# Patient Record
Sex: Male | Born: 1960
Health system: Southern US, Community
[De-identification: ages and names within clinical notes are randomized; demographics above are authoritative.]

## PROBLEM LIST (undated history)

## (undated) DIAGNOSIS — I10 Essential (primary) hypertension: Secondary | ICD-10-CM

## (undated) DIAGNOSIS — E785 Hyperlipidemia, unspecified: Secondary | ICD-10-CM

## (undated) DIAGNOSIS — E119 Type 2 diabetes mellitus without complications: Secondary | ICD-10-CM

## (undated) HISTORY — DX: Hyperlipidemia, unspecified: E78.5

## (undated) HISTORY — DX: Essential (primary) hypertension: I10

## (undated) HISTORY — DX: Type 2 diabetes mellitus without complications: E11.9

---

## 2004-09-28 ENCOUNTER — Emergency Department: Payer: Self-pay | Admitting: Emergency Medicine

## 2008-02-02 DIAGNOSIS — I10 Essential (primary) hypertension: Secondary | ICD-10-CM | POA: Insufficient documentation

## 2014-10-18 LAB — LIPID PANEL
Cholesterol: 237 mg/dL — AB (ref 0–200)
HDL: 62 mg/dL (ref 35–70)
LDL Cholesterol: 151 mg/dL
Triglycerides: 122 mg/dL (ref 40–160)

## 2014-10-18 LAB — PSA: PSA: NORMAL

## 2014-12-03 ENCOUNTER — Ambulatory Visit: Payer: Self-pay | Admitting: Gastroenterology

## 2014-12-03 LAB — HM COLONOSCOPY: HM Colonoscopy: NORMAL

## 2015-01-22 LAB — HEMOGLOBIN A1C: Hgb A1c MFr Bld: 8.5 % — AB (ref 4.0–6.0)

## 2015-06-06 ENCOUNTER — Other Ambulatory Visit: Payer: Self-pay | Admitting: Family Medicine

## 2015-06-06 MED ORDER — INSULIN DEGLUDEC 200 UNIT/ML ~~LOC~~ SOPN: 50.0000 [IU] | PEN_INJECTOR | Freq: Every day | SUBCUTANEOUS | Status: DC

## 2015-06-06 MED ORDER — INSULIN ASPART 100 UNIT/ML ~~LOC~~ SOLN: 100.0000 [IU] | Freq: Three times a day (TID) | SUBCUTANEOUS | Status: DC

## 2015-06-06 NOTE — Telephone Encounter (Signed)
Patient has an appt for 08/01/15 and is in need of a refill on Tresiba and [facility] [address]

## 2015-06-12 ENCOUNTER — Other Ambulatory Visit: Payer: Self-pay | Admitting: Family Medicine

## 2015-06-12 ENCOUNTER — Other Ambulatory Visit: Payer: Self-pay

## 2015-06-12 MED ORDER — INSULIN ASPART 100 UNIT/ML ~~LOC~~ SOLN: 20.0000 [IU] | Freq: Three times a day (TID) | SUBCUTANEOUS | Status: DC

## 2015-06-12 MED ORDER — INSULIN DEGLUDEC 200 UNIT/ML ~~LOC~~ SOPN: 50.0000 [IU] | PEN_INJECTOR | Freq: Every day | SUBCUTANEOUS | Status: DC

## 2015-06-12 NOTE — Telephone Encounter (Signed)
Patient requesting refill of 90 day supply of insulin

## 2015-06-20 ENCOUNTER — Encounter: Payer: Self-pay | Admitting: Family Medicine

## 2015-06-20 DIAGNOSIS — N521 Erectile dysfunction due to diseases classified elsewhere: Secondary | ICD-10-CM | POA: Insufficient documentation

## 2015-06-20 DIAGNOSIS — E663 Overweight: Secondary | ICD-10-CM | POA: Insufficient documentation

## 2015-06-20 DIAGNOSIS — E7800 Pure hypercholesterolemia, unspecified: Secondary | ICD-10-CM | POA: Insufficient documentation

## 2015-06-20 DIAGNOSIS — E1140 Type 2 diabetes mellitus with diabetic neuropathy, unspecified: Secondary | ICD-10-CM | POA: Insufficient documentation

## 2015-06-21 ENCOUNTER — Ambulatory Visit (INDEPENDENT_AMBULATORY_CARE_PROVIDER_SITE_OTHER): Payer: 59 | Admitting: Family Medicine

## 2015-06-21 ENCOUNTER — Encounter: Payer: Self-pay | Admitting: Family Medicine

## 2015-06-21 VITALS — BP 124/82 | HR 95 | Temp 98.7°F | Resp 16 | Wt 204.2 lb

## 2015-06-21 DIAGNOSIS — N528 Other male erectile dysfunction: Secondary | ICD-10-CM

## 2015-06-21 DIAGNOSIS — E7800 Pure hypercholesterolemia, unspecified: Secondary | ICD-10-CM

## 2015-06-21 DIAGNOSIS — B3742 Candidal balanitis: Secondary | ICD-10-CM | POA: Diagnosis not present

## 2015-06-21 DIAGNOSIS — N521 Erectile dysfunction due to diseases classified elsewhere: Secondary | ICD-10-CM

## 2015-06-21 DIAGNOSIS — E1140 Type 2 diabetes mellitus with diabetic neuropathy, unspecified: Secondary | ICD-10-CM

## 2015-06-21 DIAGNOSIS — Z79899 Other long term (current) drug therapy: Secondary | ICD-10-CM | POA: Diagnosis not present

## 2015-06-21 DIAGNOSIS — E1149 Type 2 diabetes mellitus with other diabetic neurological complication: Secondary | ICD-10-CM

## 2015-06-21 DIAGNOSIS — N529 Male erectile dysfunction, unspecified: Secondary | ICD-10-CM | POA: Insufficient documentation

## 2015-06-21 MED ORDER — FLUCONAZOLE 150 MG PO TABS: 150.0000 mg | ORAL_TABLET | ORAL | Status: DC

## 2015-06-21 MED ORDER — SILDENAFIL CITRATE 20 MG PO TABS: 20.0000 mg | ORAL_TABLET | Freq: Every day | ORAL | Status: DC | PRN

## 2015-06-21 NOTE — Patient Instructions (Signed)
You can get some Clotrimazole otc ( Monistat ) but do not start today because it may cause increase in irritation  Balanitis Balanitis is inflammation of the head of the penis (glans).  CAUSES  Balanitis has multiple causes, both infectious and noninfectious. Often balanitis is the result of poor personal hygiene, especially in uncircumcised males. Without adequate washing, viruses, bacteria, and yeast collect between the foreskin and the glans. This can cause an infection. Lack of air and irritation from a normal secretion called smegma contribute to the cause in uncircumcised males. Other causes include:  Chemical irritation from the use of certain soaps and shower gels (especially soaps with perfumes), condoms, personal lubricants, petroleum jelly, spermicides, and fabric conditioners.  Skin conditions, such as eczema, dermatitis, and psoriasis.  Allergies to drugs, such as tetracycline and sulfa.  Certain medical conditions, including liver cirrhosis, congestive heart failure, and kidney disease.  Morbid obesity. RISK FACTORS  Diabetes mellitus.  A tight foreskin that is difficult to pull back past the glans (phimosis).  Sex without the use of a condom. SIGNS AND SYMPTOMS  Symptoms may include:  Discharge coming from under the foreskin.  Tenderness.  Itching and inability to get an erection (because of the pain).  Redness and a rash.  Sores on the glans and on the foreskin. DIAGNOSIS Diagnosis of balanitis is confirmed through a physical exam. TREATMENT The treatment is based on the cause of the balanitis. Treatment may include:  Frequent cleansing.  Keeping the glans and foreskin dry.  Use of medicines such as creams, pain medicines, antibiotics, or medicines to treat fungal infections.  Sitz baths. If the irritation has caused a scar on the foreskin that prevents easy retraction, a circumcision may be recommended.  HOME CARE INSTRUCTIONS  Sex should be avoided  until the condition has cleared. MAKE SURE YOU:  Understand these instructions.  Will watch your condition.  Will get help right away if you are not doing well or get worse. Document Released: 04/11/2009 Document Revised: 11/28/2013 Document Reviewed: 05/15/2013 [facility] Patient Information 2015 [place], [place]. This information is not intended to replace advice given to you by your health care provider. Make sure you discuss any questions you have with your health care provider.

## 2015-06-21 NOTE — Progress Notes (Signed)
Name: Joseph Johnson   MRN: [id number]    DOB: 09-02-1961   Date:06/21/2015       Progress Note  Subjective  Chief Complaint  Chief Complaint  Patient presents with  . Medication Refill    HPI  Balanitis: he had one previous episode about two years ago. Symptoms started with itching and some white patches on gland of penis.  Going on for the past four days, the symptoms are getting progressively worse.  No new sexual partners, states that glucose at home has been under control around 120's fasting. He takes [place] that caused glucosuria.   DM with ED: he has a follow up in a few weeks, states glucose is at goal, he has noticed problems with erections for the past year.  He notices problems starting an erection, erection is not as full as it used to be. He is still able to reach an orgasms.   Patient Active Problem List   Diagnosis Date Noted  . ED (erectile dysfunction) 06/21/2015  . 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 Hypercholesteremia 06/20/2015  . Excess weight 06/20/2015  . Benign essential HTN 02/02/2008    No past surgical history on file.  No family history on file.  History   Social History  . Marital Status: Married    Spouse Name: N/A  . Number of Children: N/A  . Years of Education: N/A   Occupational History  . Not on file.   Social History Main Topics  . Smoking status: Former Games developermoker  . Smokeless tobacco: Not on file  . Alcohol Use: No  . Drug Use: No  . Sexual Activity: Not on file   Other Topics Concern  . Not on file   Social History Narrative  . No narrative on file     Current outpatient prescriptions:  .  amLODipine-benazepril (LOTREL) 5-40 MG per capsule, Take by mouth., Disp: , Rfl:  .  aspirin 81 MG tablet, , Disp: , Rfl:  .  atorvastatin (LIPITOR) 40 MG tablet, Take by mouth., Disp: , Rfl:  .  chlorpheniramine (CHLOR-TRIMETON) 4 MG tablet, Take by mouth., Disp: , Rfl:  .   dapagliflozin propanediol (FARXIGA) 10 MG TABS tablet, Take by mouth., Disp: , Rfl:  .  B-D INS SYRINGE 0.5CC/31GX5/16 31G X 5/16" 0.5 ML MISC, 4 (four) times daily., Disp: , Rfl: 5 .  fluconazole (DIFLUCAN) 150 MG tablet, Take 1 tablet (150 mg total) by mouth every other day., Disp: 3 tablet, Rfl: 1 .  insulin aspart (NOVOLOG) 100 UNIT/ML injection, Inject 20 Units into the skin 3 (three) times daily with meals., Disp: 6 vial, Rfl: 0 .  Insulin Degludec (TRESIBA FLEXTOUCH) 200 UNIT/ML SOPN, Inject 50 Units into the skin daily., Disp: 5 pen, Rfl: 0 .  sildenafil (REVATIO) 20 MG tablet, Take 1 tablet (20 mg total) by mouth daily as needed., Disp: 10 tablet, Rfl: 2  No Known Allergies   ROS  Constitutional: Negative for fever or weight change.  Respiratory: Negative for cough and shortness of breath.   Cardiovascular: Negative for chest pain or palpitations.  Gastrointestinal: Negative for abdominal pain, no bowel changes.  Musculoskeletal: Negative for gait problem or joint swelling.  Skin: penile for rash.  Neurological: Negative for dizziness or headache.  No other specific complaints in a complete review of systems (except as listed in HPI above).  Objective  Filed Vitals:   06/21/15 0817  BP: 124/82  Pulse: 95  Temp:  98.7 F (37.1 C)  Resp: 16  Weight: 204 lb 3 oz (92.619 kg)  SpO2: 97%    Body mass index is 29.3 kg/(m^2).  Physical Exam  Constitutional: Patient appears well-developed and well-nourished. No distress.  Eyes:  No scleral icterus.  Neck: Normal range of motion. Neck supple. Cardiovascular: Normal rate, regular rhythm and normal heart sounds.  No murmur heard. No BLE edema. Pulmonary/Chest: Effort normal and breath sounds normal. No respiratory distress. Abdominal: Soft.  There is no tenderness. Genitalia: uncircumcised penis, some swelling of the foreskin, gland of penis is erythematous with white plaques tric: Patient has a normal mood and affect.  behavior is normal. Judgment and thought content normal.   PHQ2/9: Depression screen PHQ 2/9 06/21/2015  Decreased Interest 0  Down, Depressed, Hopeless 0  PHQ - 2 Score 0    Fall Risk: Fall Risk  06/21/2015  Falls in the past year? No    Assessment & Plan  1. 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 Other male erectile dysfunction Discussed risk and benefits, explained medication is used for PAD, and is off label use for ED, advised to read side effects of medication, including visual disturbances - sildenafil (REVATIO) 20 MG tablet; Take 1 tablet (20 mg total) by mouth daily as needed.  Dispense: 10 tablet; Refill: 2  3. Candidal balanitis Start  Medication, drink more water - fluconazole (DIFLUCAN) 150 MG tablet; Take 1 tablet (150 mg total) by mouth every other day.  Dispense: 3 tablet; Refill: 1  4. Hypercholesterolemia  - Lipid Profile Before next visit  5. Long-term use of high-risk medication  - Comprehensive Metabolic Panel (CMET)

## 2015-07-04 ENCOUNTER — Other Ambulatory Visit: Payer: Self-pay

## 2015-07-05 ENCOUNTER — Other Ambulatory Visit: Payer: Self-pay

## 2015-07-05 MED ORDER — INSULIN DEGLUDEC 200 UNIT/ML ~~LOC~~ SOPN: 50.0000 [IU] | PEN_INJECTOR | Freq: Every day | SUBCUTANEOUS | Status: DC

## 2015-07-05 NOTE — Telephone Encounter (Signed)
Patient requesting refill. 

## 2015-07-08 ENCOUNTER — Other Ambulatory Visit: Payer: Self-pay

## 2015-07-08 MED ORDER — INSULIN DEGLUDEC 200 UNIT/ML ~~LOC~~ SOPN: 50.0000 [IU] | PEN_INJECTOR | Freq: Every day | SUBCUTANEOUS | Status: DC

## 2015-07-08 NOTE — Telephone Encounter (Signed)
[facility] [place] [facility] that patient medication for Joseph Johnson must be a 90 day supply. Can you please switch and sent refill request back in. Thanks

## 2015-07-09 MED ORDER — INSULIN DEGLUDEC 200 UNIT/ML ~~LOC~~ SOPN: 50.0000 [IU] | PEN_INJECTOR | Freq: Every day | SUBCUTANEOUS | Status: DC

## 2015-08-01 ENCOUNTER — Ambulatory Visit: Payer: Self-pay | Admitting: Family Medicine

## 2015-08-01 LAB — HEMOGLOBIN A1C
Est. average glucose Bld gHb Est-mCnc: 232 mg/dL
Hgb A1c MFr Bld: 9.7 % — ABNORMAL HIGH (ref 4.8–5.6)

## 2015-08-02 LAB — LIPID PANEL
Chol/HDL Ratio: 4.8 ratio units (ref 0.0–5.0)
Cholesterol, Total: 249 mg/dL — ABNORMAL HIGH (ref 100–199)
HDL: 52 mg/dL (ref >39)
LDL Calculated: 144 mg/dL — ABNORMAL HIGH (ref 0–99)
Triglycerides: 265 mg/dL — ABNORMAL HIGH (ref 0–149)
VLDL Cholesterol Cal: 53 mg/dL — ABNORMAL HIGH (ref 5–40)

## 2015-08-02 LAB — COMPREHENSIVE METABOLIC PANEL
ALT: 16 IU/L (ref 0–44)
AST: 17 IU/L (ref 0–40)
Albumin/Globulin Ratio: 2.1 (ref 1.1–2.5)
Albumin: 4.2 g/dL (ref 3.5–5.5)
Alkaline Phosphatase: 71 IU/L (ref 39–117)
BUN/Creatinine Ratio: 14 (ref 9–20)
BUN: 15 mg/dL (ref 6–24)
Bilirubin Total: 1.4 mg/dL — ABNORMAL HIGH (ref 0.0–1.2)
CO2: 26 mmol/L (ref 18–29)
Calcium: 9.5 mg/dL (ref 8.7–10.2)
Chloride: 98 mmol/L (ref 97–108)
Creatinine, Ser: 1.06 mg/dL (ref 0.76–1.27)
GFR calc Af Amer: 93 mL/min/{1.73_m2} (ref >59)
GFR calc non Af Amer: 81 mL/min/{1.73_m2} (ref >59)
Globulin, Total: 2 g/dL (ref 1.5–4.5)
Glucose: 289 mg/dL — ABNORMAL HIGH (ref 65–99)
Potassium: 4.7 mmol/L (ref 3.5–5.2)
Sodium: 140 mmol/L (ref 134–144)
Total Protein: 6.2 g/dL (ref 6.0–8.5)

## 2015-08-02 NOTE — Progress Notes (Signed)
Pt notified and will schedule another appointment

## 2015-08-06 NOTE — Progress Notes (Signed)
Pt notified needed to make appointment

## 2015-08-08 ENCOUNTER — Ambulatory Visit: Payer: Self-pay | Admitting: Family Medicine

## 2015-09-20 ENCOUNTER — Encounter: Payer: Self-pay | Admitting: Family Medicine

## 2015-09-20 ENCOUNTER — Ambulatory Visit (INDEPENDENT_AMBULATORY_CARE_PROVIDER_SITE_OTHER): Payer: 59 | Admitting: Family Medicine

## 2015-09-20 VITALS — BP 138/86 | HR 100 | Temp 98.2°F | Resp 18 | Ht 70.0 in | Wt 196.7 lb

## 2015-09-20 DIAGNOSIS — I10 Essential (primary) hypertension: Secondary | ICD-10-CM | POA: Diagnosis not present

## 2015-09-20 DIAGNOSIS — Z23 Encounter for immunization: Secondary | ICD-10-CM

## 2015-09-20 DIAGNOSIS — E7800 Pure hypercholesterolemia, unspecified: Secondary | ICD-10-CM

## 2015-09-20 DIAGNOSIS — E1140 Type 2 diabetes mellitus with diabetic neuropathy, unspecified: Secondary | ICD-10-CM

## 2015-09-20 DIAGNOSIS — N521 Erectile dysfunction due to diseases classified elsewhere: Secondary | ICD-10-CM | POA: Diagnosis not present

## 2015-09-20 DIAGNOSIS — E1149 Type 2 diabetes mellitus with other diabetic neurological complication: Secondary | ICD-10-CM

## 2015-09-20 DIAGNOSIS — Z9114 Patient's other noncompliance with medication regimen: Secondary | ICD-10-CM

## 2015-09-20 DIAGNOSIS — M25512 Pain in left shoulder: Secondary | ICD-10-CM

## 2015-09-20 DIAGNOSIS — Z91148 Patient's other noncompliance with medication regimen for other reason: Secondary | ICD-10-CM

## 2015-09-20 LAB — POCT UA - MICROALBUMIN: Microalbumin Ur, POC: 20 mg/L

## 2015-09-20 MED ORDER — AMLODIPINE BESY-BENAZEPRIL HCL 5-40 MG PO CAPS: 1.0000 | ORAL_CAPSULE | Freq: Every day | ORAL | Status: DC

## 2015-09-20 MED ORDER — INSULIN ASPART 100 UNIT/ML ~~LOC~~ SOLN: 20.0000 [IU] | Freq: Three times a day (TID) | SUBCUTANEOUS | Status: DC

## 2015-09-20 MED ORDER — INSULIN DEGLUDEC 200 UNIT/ML ~~LOC~~ SOPN: 50.0000 [IU] | PEN_INJECTOR | Freq: Every day | SUBCUTANEOUS | Status: DC

## 2015-09-20 MED ORDER — ATORVASTATIN CALCIUM 40 MG PO TABS: 40.0000 mg | ORAL_TABLET | Freq: Every day | ORAL | Status: DC

## 2015-09-20 NOTE — Progress Notes (Signed)
Name: Joseph Johnson   MRN: [id number]    DOB: 11-12-1961   Date:09/20/2015       Progress Note  Subjective  Chief Complaint  Chief Complaint  Patient presents with  . Medication Refill  . Diabetes    Checks BS once mid-day, Low-62 High-267  . Hypertension    Patient has been walking 2 miles daily and has losted 8 pounds since last visit and cut back on medication to once daily.  . Hyperlipidemia    HPI   DM with ED: he has a follow up in a few weeks, states glucose is at goal, he has noticed problems with erections for the past year. He notices problems starting an erection, erection is not as full as it used to be. He is still able to reach an orgasms. He tried to get prescription at pharmacy but it was too expensive.  He is only on [place] and Novolog at this time. Did not like side effects of Farxiga - had balanitis and urinary frequency.  He has lost weight since last visit , walking 2 miles per day, he has been compliant with insulin for the past month.  He denies polyphagia, polyuria or polydipsia  HTN: he has been skipping medication of Lotrel. Taking it only once daily, because his bp has been under control since he started to walk daily , denies chest pain or palpitatoin  Hyperlipidemia: not taking Atorvastatin at this time, reviewed labs with him, LDL was 141, and high triglycerides, discussed risk of heart attacks and strokes with his today  Shoulder pain left: symptoms started 2 years ago, gradually increased but is gradually improving with physical activity over the past month. Pain is described as sharp sensation that is triggered by certain movements - such as twisting left arm down and internal rotation    Patient Active Problem List   Diagnosis Date Noted  . ED (erectile dysfunction) 06/21/2015  . 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 Hypercholesteremia 06/20/2015  . Excess weight 06/20/2015  . Benign  essential HTN 02/02/2008    History reviewed. No pertinent past surgical history.  Family History  Problem Relation Age of Onset  . Hypertension Mother   . Diabetes Father   . Diabetes Brother     Social History   Social History  . Marital Status: Married    Spouse Name: N/A  . Number of Children: N/A  . Years of Education: N/A   Occupational History  . Not on file.   Social History Main Topics  . Smoking status: Former Smoker    Types: Cigarettes    Quit date: 09/19/1990  . Smokeless tobacco: Never Used  . Alcohol Use: No  . Drug Use: No  . Sexual Activity:    Partners: Female   Other Topics Concern  . Not on file   Social History Narrative     Current outpatient prescriptions:  .  amLODipine-benazepril (LOTREL) 5-40 MG per capsule, Take by mouth., Disp: , Rfl:  .  aspirin 81 MG tablet, , Disp: , Rfl:  .  atorvastatin (LIPITOR) 40 MG tablet, Take by mouth., Disp: , Rfl:  .  B-D INS SYRINGE 0.5CC/31GX5/16 31G X 5/16" 0.5 ML MISC, 4 (four) times daily., Disp: , Rfl: 5 .  insulin aspart (NOVOLOG) 100 UNIT/ML injection, Inject 20 Units into the skin 3 (three) times daily with meals., Disp: 6 vial, Rfl: 0 .  Insulin Degludec (TRESIBA FLEXTOUCH) 200 UNIT/ML SOPN,  Inject 50 Units into the skin daily., Disp: 5 pen, Rfl: 0  No Known Allergies   ROS  Constitutional: Negative for fever , positive for weight change.  Respiratory: Positive for mild  cough no  shortness of breath.  He has a mild cold, taking otc medication and is helping Cardiovascular: Negative for chest pain or palpitations.  Gastrointestinal: Negative for abdominal pain, no bowel changes.  Musculoskeletal: Negative for gait problem or joint swelling.  Skin: Negative for rash.  Neurological: Negative for dizziness or headache.  No other specific complaints in a complete review of systems (except as listed in HPI above).  Objective  Filed Vitals:   09/20/15 1020  BP: 138/86  Pulse: 100  Temp:  98.2 F (36.8 C)  TempSrc: Oral  Resp: 18  Height:  (1.778 m)  Weight: 196 lb 11.2 oz (89.223 kg)  SpO2: 98%    Body mass index is 28.22 kg/(m^2).  Physical Exam  Constitutional: Patient appears well-developed and well-nourished.  No distress.  HEENT: head atraumatic, normocephalic, pupils equal and reactive to light, , neck supple, throat within normal limits Cardiovascular: Normal rate, regular rhythm and normal heart sounds.  No murmur heard. No BLE edema. Pulmonary/Chest: Effort normal and breath sounds normal. No respiratory distress. Abdominal: Soft.  There is no tenderness. Psychiatric: Patient has a normal mood and affect. behavior is normal. Judgment and thought content normal. Muscular Skeletal: pain on top of left shoulder during internal rotation, no rashes, negative empty can, normal abduction   Recent Results (from the past 2160 hour(s))  Lipid Profile     Status: Abnormal   Collection Time: 08/01/15  9:19 AM  Result Value Ref Range   Cholesterol, Total 249 (H) 100 - 199 mg/dL   Triglycerides 161 (H) 0 - 149 mg/dL   HDL 52 >09 mg/dL    Comment: According to ATP-III Guidelines, HDL-C >59 mg/dL is considered a negative risk factor for CHD.    VLDL Cholesterol Cal 53 (H) 5 - 40 mg/dL   LDL Calculated 604 (H) 0 - 99 mg/dL   Chol/HDL Ratio 4.8 0.0 - 5.0 ratio units    Comment:                                   T. Chol/HDL Ratio                                             Men  Women                               1/2 Avg.Risk  3.4    3.3                                   Avg.Risk  5.0    4.4                                2X Avg.Risk  9.6    7.1  3X Avg.Risk 23.4   11.0   Comprehensive Metabolic Panel (CMET)     Status: Abnormal   Collection Time: 08/01/15  9:19 AM  Result Value Ref Range   Glucose 289 (H) 65 - 99 mg/dL   BUN 15 6 - 24 mg/dL   Creatinine, Ser 7.821.06 0.76 - 1.27 mg/dL   GFR calc non Af Amer 81 >59 mL/min/1.73    GFR calc Af Amer 93 >59 mL/min/1.73   BUN/Creatinine Ratio 14 9 - 20   Sodium 140 134 - 144 mmol/L   Potassium 4.7 3.5 - 5.2 mmol/L   Chloride 98 97 - 108 mmol/L   CO2 26 18 - 29 mmol/L   Calcium 9.5 8.7 - 10.2 mg/dL   Total Protein 6.2 6.0 - 8.5 g/dL   Albumin 4.2 3.5 - 5.5 g/dL   Globulin, Total 2.0 1.5 - 4.5 g/dL   Albumin/Globulin Ratio 2.1 1.1 - 2.5   Bilirubin Total 1.4 (H) 0.0 - 1.2 mg/dL   Alkaline Phosphatase 71 39 - 117 IU/L   AST 17 0 - 40 IU/L   ALT 16 0 - 44 IU/L  HgB A1c     Status: Abnormal   Collection Time: 08/01/15  9:19 AM  Result Value Ref Range   Hgb A1c MFr Bld 9.7 (H) 4.8 - 5.6 %    Comment:          Pre-diabetes: 5.7 - 6.4          Diabetes: >6.4          Glycemic control for adults with diabetes: <7.0    Est. average glucose Bld gHb Est-mCnc 232 mg/dL  POCT UA - Microalbumin     Status: None   Collection Time: 09/20/15 10:35 AM  Result Value Ref Range   Microalbumin Ur, POC 20 mg/L   Creatinine, POC  mg/dL   Albumin/Creatinine Ratio, Urine, POC      PHQ2/9: Depression screen [facility] 2/9 09/20/2015 06/21/2015  Decreased Interest 0 0  Down, Depressed, Hopeless 0 0  PHQ - 2 Score 0 0    Fall Risk: Fall Risk  09/20/2015 06/21/2015  Falls in the past year? No No    Functional Status Survey: Is the patient deaf or have difficulty hearing?: No Does the patient have difficulty seeing, even when wearing glasses/contacts?: Yes (glasses) Does the patient have difficulty concentrating, remembering, or making decisions?: No Does the patient have difficulty walking or climbing stairs?: No Does the patient have difficulty dressing or bathing?: No Does the patient have difficulty doing errands alone such as visiting a doctor's office or shopping?: No   Assessment & Plan    1. Type II diabetes mellitus with neuropathy causing erectile dysfunction (HCC)   needs to be compliant with diet and continue physical activity, continue daily insulin medication, he  is unable to tolerate metformin or Farxiga.   - POCT UA - Microalbumin - insulin aspart (NOVOLOG) 100 UNIT/ML injection; Inject 20 Units into the skin 3 (three) times daily with meals.  Dispense: 6 vial; Refill: 0 - Insulin Degludec (TRESIBA FLEXTOUCH) 200 UNIT/ML SOPN; Inject 50 Units into the skin daily.  Dispense: 15 pen; Refill: 1  2. Benign essential HTN  - amLODipine-benazepril (LOTREL) 5-40 MG capsule; Take 1 capsule by mouth daily.  Dispense: 90 capsule; Refill: 1  3. Erectile dysfunction due to diseases classified elsewhere  Can't afford medication   4. Hypercholesteremia  Needs to take medication daily  - atorvastatin (LIPITOR) 40 MG tablet; Take 1  tablet (40 mg total) by mouth daily.  Dispense: 90 tablet; Refill: 1  5. Non compliance w medication regimen  Discussed risk of not taking medication   6. Needs flu shot  - Flu Vaccine QUAD 36+ mos PF IM (Fluarix & Fluzone Quad PF)  7. Left shoulder pain  Rotator cuff symptoms, discussed PT but he wants to hold off , he states it is improving with home exercises.

## 2015-11-11 ENCOUNTER — Ambulatory Visit (INDEPENDENT_AMBULATORY_CARE_PROVIDER_SITE_OTHER): Payer: 59 | Admitting: Family Medicine

## 2015-11-11 ENCOUNTER — Encounter: Payer: Self-pay | Admitting: Family Medicine

## 2015-11-11 VITALS — BP 138/88 | HR 106 | Temp 98.6°F | Resp 18 | Ht 70.0 in | Wt 191.5 lb

## 2015-11-11 DIAGNOSIS — B3742 Candidal balanitis: Secondary | ICD-10-CM | POA: Diagnosis not present

## 2015-11-11 DIAGNOSIS — E7800 Pure hypercholesterolemia, unspecified: Secondary | ICD-10-CM

## 2015-11-11 DIAGNOSIS — N521 Erectile dysfunction due to diseases classified elsewhere: Secondary | ICD-10-CM

## 2015-11-11 DIAGNOSIS — Z23 Encounter for immunization: Secondary | ICD-10-CM

## 2015-11-11 DIAGNOSIS — E1149 Type 2 diabetes mellitus with other diabetic neurological complication: Secondary | ICD-10-CM

## 2015-11-11 DIAGNOSIS — I10 Essential (primary) hypertension: Secondary | ICD-10-CM

## 2015-11-11 DIAGNOSIS — E1140 Type 2 diabetes mellitus with diabetic neuropathy, unspecified: Secondary | ICD-10-CM

## 2015-11-11 LAB — POCT GLYCOSYLATED HEMOGLOBIN (HGB A1C): Hemoglobin A1C: 10.4

## 2015-11-11 MED ORDER — GLUCOSE BLOOD VI STRP: ORAL_STRIP | Status: DC

## 2015-11-11 MED ORDER — FLUCONAZOLE 150 MG PO TABS: 150.0000 mg | ORAL_TABLET | ORAL | Status: DC

## 2015-11-11 NOTE — Progress Notes (Signed)
Name: Joseph Johnson   MRN: [id number]    DOB: [birth year]   Date:11/11/2015       Progress Note  Subjective  Chief Complaint  Chief Complaint  Patient presents with  . Medication Refill    5 week F/U  . Hypertension    Checks at home and get's 130/90 with no side effects   . Diabetes    Checks once daily averages about 120-130 highest has been 230, but has been without strips for the past 2 weeks.  . Hyperlipidemia    HPI  DM with ED: Glucose has improved at home. He has noticed problems with erections for the past year. He notices problems starting an erection, erection is not as full as it used to be. He is still able to reach an orgasms. He tried to get prescription at pharmacy but it was too expensive. He is only on [place] and Novolog at this time, however only using Novolog once or  twice daily . Did not like side effects of Farxiga - had balanitis and urinary frequency, Metformin caused diarrhea He has lost weight since last visit 5.5 lbs , walking 2 miles per day.  He denies polyphagia or polydipsia. However had an episode of urinary frequency a couple of weeks ago. He stopped taking Atorvastatin it was a side effect of medication, but explained that it was likely secondary to glucose not being controlled. He has changed his diet and  has decreased the amount that he eats. Avoiding bread, occasionally has sweet beverages  HTN: he has been compliant with Lotrel lately. Taking it twice daily again. He denies chest pain or palpitatoin  Hyperlipidemia: not taking Atorvastatin at this time, reviewed labs with him, LDL was 141, and high triglycerides, discussed risk of heart attacks and strokes with his today.   Balanitis: he states he has noticed the symptoms recurring two days ago, with itching and redness and would like a refill of Diflucan  Patient Active Problem List   Diagnosis Date Noted  . ED (erectile dysfunction) 06/21/2015  . Type II diabetes mellitus with neuropathy  causing erectile dysfunction (HCC) 06/20/2015  . Hypercholesteremia 06/20/2015  . Excess weight 06/20/2015  . Benign essential HTN 02/02/2008    History reviewed. No pertinent past surgical history.  Family History  Problem Relation Age of Onset  . Hypertension Mother   . Diabetes Father   . Diabetes Brother     Social History   Social History  . Marital Status: Married    Spouse Name: N/A  . Number of Children: N/A  . Years of Education: N/A   Occupational History  . Not on file.   Social History Main Topics  . Smoking status: Former Smoker    Types: Cigarettes    Quit date: 09/19/1990  . Smokeless tobacco: Never Used  . Alcohol Use: No  . Drug Use: No  . Sexual Activity:    Partners: Female   Other Topics Concern  . Not on file   Social History Narrative     Current outpatient prescriptions:  .  amLODipine-benazepril (LOTREL) 5-40 MG capsule, Take 1 capsule by mouth daily., Disp: 90 capsule, Rfl: 1 .  aspirin 81 MG tablet, , Disp: , Rfl:  .  atorvastatin (LIPITOR) 40 MG tablet, Take 1 tablet (40 mg total) by mouth daily., Disp: 90 tablet, Rfl: 1 .  B-D INS SYRINGE 0.5CC/31GX5/16 31G X 5/16" 0.5 ML MISC, 4 (four) times daily., Disp: , Rfl: 5 .  insulin  aspart (NOVOLOG) 100 UNIT/ML injection, Inject 20 Units into the skin 3 (three) times daily with meals., Disp: 6 vial, Rfl: 0 .  Insulin Degludec (TRESIBA FLEXTOUCH) 200 UNIT/ML SOPN, Inject 50 Units into the skin daily., Disp: 15 pen, Rfl: 1 .  fluconazole (DIFLUCAN) 150 MG tablet, Take 1 tablet (150 mg total) by mouth every other day., Disp: 6 tablet, Rfl: 2 .  glucose blood (ONE TOUCH ULTRA TEST) test strip, Use as instructed, Disp: 100 each, Rfl: 12  No Known Allergies   ROS  Constitutional: Negative for fever and positive for weight change.  Respiratory: Negative for cough and shortness of breath.   Cardiovascular: Negative for chest pain or palpitations.  Gastrointestinal: Negative for abdominal pain,  no bowel changes.  Musculoskeletal: Negative for gait problem or joint swelling.  Skin: Positive for rash.  Neurological: Negative for dizziness or headache.  No other specific complaints in a complete review of systems (except as listed in HPI above).  Objective  Filed Vitals:   11/11/15 1032  BP: 138/88  Pulse: 106  Temp: 98.6 F (37 C)  TempSrc: Oral  Resp: 18  Height:  (1.778 m)  Weight: 191 lb 8 oz (86.864 kg)  SpO2: 96%    Body mass index is 27.48 kg/(m^2).  Physical Exam  Constitutional: Patient appears well-developed and well-nourished. No distress.  HEENT: head atraumatic, normocephalic, pupils equal and reactive to lightneck supple, throat within normal limits Cardiovascular: Normal rate, regular rhythm and normal heart sounds.  No murmur heard. No BLE edema. Pulmonary/Chest: Effort normal and breath sounds normal. No respiratory distress. Abdominal: Soft.  There is no tenderness. Psychiatric: Patient has a normal mood and affect. behavior is normal. Judgment and thought content normal.  Recent Results (from the past 2160 hour(s))  POCT UA - Microalbumin     Status: None   Collection Time: 09/20/15 10:35 AM  Result Value Ref Range   Microalbumin Ur, POC 20 mg/L   Creatinine, POC  mg/dL   Albumin/Creatinine Ratio, Urine, POC    POCT HgB A1C     Status: Abnormal   Collection Time: 11/11/15 10:51 AM  Result Value Ref Range   Hemoglobin A1C 10.4      PHQ2/9: Depression screen [facility] 2/9 09/20/2015 06/21/2015  Decreased Interest 0 0  Down, Depressed, Hopeless 0 0  PHQ - 2 Score 0 0    Fall Risk: Fall Risk  09/20/2015 06/21/2015  Falls in the past year? No No     Assessment & Plan  1. Type II diabetes mellitus with neuropathy causing erectile dysfunction (HCC)  - POCT HgB A1C 10.4. Explained to him that the goal is to get below 7. Advised referral to Endo or dietician, but he refuses both. He states he will start to do what he is supposed to do.  Like using insulin pre meals. Also following his diet, continue physical activity - glucose blood (ONE TOUCH ULTRA TEST) test strip; Use as instructed  Dispense: 100 each; Refill: 12  2. Candidal balanitis  - fluconazole (DIFLUCAN) 150 MG tablet; Take 1 tablet (150 mg total) by mouth every other day.  Dispense: 6 tablet; Refill: 2  3. Hypercholesteremia  Needs to resume statin therapy   4. Benign essential HTN  Doing well on medication   5. Need for Streptococcus pneumoniae vaccination  - Pneumococcal conjugate vaccine 13-valent IM

## 2015-12-27 ENCOUNTER — Ambulatory Visit: Payer: 59 | Admitting: Family Medicine

## 2016-01-03 ENCOUNTER — Encounter: Payer: Self-pay | Admitting: Family Medicine

## 2016-01-03 ENCOUNTER — Ambulatory Visit (INDEPENDENT_AMBULATORY_CARE_PROVIDER_SITE_OTHER): Payer: 59 | Admitting: Family Medicine

## 2016-01-03 VITALS — BP 132/76 | HR 92 | Temp 98.0°F | Resp 16 | Ht 70.0 in | Wt 199.1 lb

## 2016-01-03 DIAGNOSIS — E1149 Type 2 diabetes mellitus with other diabetic neurological complication: Secondary | ICD-10-CM | POA: Diagnosis not present

## 2016-01-03 DIAGNOSIS — J40 Bronchitis, not specified as acute or chronic: Secondary | ICD-10-CM | POA: Diagnosis not present

## 2016-01-03 DIAGNOSIS — N521 Erectile dysfunction due to diseases classified elsewhere: Secondary | ICD-10-CM | POA: Diagnosis not present

## 2016-01-03 DIAGNOSIS — E1140 Type 2 diabetes mellitus with diabetic neuropathy, unspecified: Secondary | ICD-10-CM

## 2016-01-03 DIAGNOSIS — Z23 Encounter for immunization: Secondary | ICD-10-CM | POA: Diagnosis not present

## 2016-01-03 MED ORDER — FLUTICASONE FUROATE-VILANTEROL 100-25 MCG/INH IN AEPB: 1.0000 | INHALATION_SPRAY | Freq: Every day | RESPIRATORY_TRACT | Status: DC

## 2016-01-03 MED ORDER — HYDROCOD POLST-CPM POLST ER 10-8 MG/5ML PO SUER: 5.0000 mL | Freq: Two times a day (BID) | ORAL | Status: DC | PRN

## 2016-01-03 MED ORDER — GLUCOSE BLOOD VI STRP: ORAL_STRIP | Status: DC

## 2016-01-03 NOTE — Progress Notes (Signed)
Name: Joseph Johnson   MRN: [id number]    DOB: 1960/12/24   Date:01/03/2016       Progress Note  Subjective  Chief Complaint  Chief Complaint  Patient presents with  . Follow-up    6 week F/U  . Diabetes    Checking sugar once or twice daily, pre-prandial sugar highest-243 lowest-48 on 12/31/15 Average-80-130. Drank orange juice when patient was having hypogylcemia episode.   . Bronchitis    Patient went to [place] on Sunday morning and was given antibiotics, inhaler, and cough medication. But is still wheezing and feels like he needs something a little stronger for his cough.     HPI  DMII: he has been checking glucose at home and fasting has been around 140. He had three episodes of hypoglycemia. 48 at 3 am, and 52 before lunch once and 66 before breakfast.No postprandial readings, highest level was this week was 243, he states he had a birthday cake that day. He knows when it is low, shaking, sweaty. He always has orange juice by his bed, also advised to have a peppermint with him at all times. I also discussed glucagon emergency kit and an emergency bracelet. He states he does not think his wife would be able to give him an injection and would like to hold off on Glucagon. He has been compliant with his medication, and states that his balanitis cleared up.   Bronchitis: he went to Urgent care for cough and wheezing over the weekend. He was diagnosed with Bronchitis and given a Z-pack, Ventolin and tessalon perles. He states the cough is worse at night and would like to try something else. The wheezing is slightly better, but he would like to feel better quicker. No fever, no chills, normal appetite.   Patient Active Problem List   Diagnosis Date Noted  . ED (erectile dysfunction) 06/21/2015  . 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 Hypercholesteremia 06/20/2015  . Excess weight 06/20/2015  . Benign essential HTN 02/02/2008     No past surgical history on file.  Family History  Problem Relation Age of Onset  . Hypertension Mother   . Diabetes Father   . Diabetes Brother     Social History   Social History  . Marital Status: Married    Spouse Name: N/A  . Number of Children: N/A  . Years of Education: N/A   Occupational History  . Not on file.   Social History Main Topics  . Smoking status: Former Smoker    Types: Cigarettes    Quit date: 09/19/1990  . Smokeless tobacco: Never Used  . Alcohol Use: No  . Drug Use: No  . Sexual Activity:    Partners: Female   Other Topics Concern  . Not on file   Social History Narrative     Current outpatient prescriptions:  .  amLODipine-benazepril (LOTREL) 5-40 MG capsule, Take 1 capsule by mouth daily., Disp: 90 capsule, Rfl: 1 .  aspirin 81 MG tablet, , Disp: , Rfl:  .  atorvastatin (LIPITOR) 40 MG tablet, Take 1 tablet (40 mg total) by mouth daily., Disp: 90 tablet, Rfl: 1 .  azithromycin (ZITHROMAX) 250 MG tablet, TAKE 2 TABLETS BY MOUTH TODAY, THEN TAKE 1 TABLET DAILY FOR 4 DAYS, Disp: , Rfl: 0 .  B-D INS SYRINGE 0.5CC/31GX5/16 31G X 5/16" 0.5 ML MISC, 4 (four) times daily., Disp: , Rfl: 5 .  benzonatate (TESSALON) 200 MG capsule, TAKE  ONE CAPSULE BY MOUTH 3 TIMES A DAY AS NEEDED, Disp: , Rfl: 0 .  chlorpheniramine-HYDROcodone (TUSSIONEX PENNKINETIC ER) 10-8 MG/5ML SUER, Take 5 mLs by mouth every 12 (twelve) hours as needed., Disp: 140 mL, Rfl: 0 .  Fluticasone Furoate-Vilanterol (BREO ELLIPTA) 100-25 MCG/INH AEPB, Inhale 1 puff into the lungs daily., Disp: 60 each, Rfl: 0 .  glucose blood (ONE TOUCH ULTRA TEST) test strip, Use as instructed, Disp: 100 each, Rfl: 2 .  insulin aspart (NOVOLOG) 100 UNIT/ML injection, Inject 20 Units into the skin 3 (three) times daily with meals., Disp: 6 vial, Rfl: 0 .  Insulin Degludec (TRESIBA FLEXTOUCH) 200 UNIT/ML SOPN, Inject 50 Units into the skin daily., Disp: 15 pen, Rfl: 1 .  VENTOLIN HFA 108 (90 Base)  MCG/ACT inhaler, INHALE 2 PUFFS EVERY 4-6 HOURS AS NEEDED, Disp: , Rfl: 0  No Known Allergies   ROS  Constitutional: Negative for fever or weight change.  Respiratory: Positive for cough no  shortness of breath.   Cardiovascular: Negative for chest pain or palpitations.  Gastrointestinal: Negative for abdominal pain, no bowel changes.  Musculoskeletal: Negative for gait problem or joint swelling.  Skin: Negative for rash.  Neurological: Negative for dizziness or headache.  No other specific complaints in a complete review of systems (except as listed in HPI above).  Objective  Filed Vitals:   01/03/16 1137  BP: 132/76  Pulse: 92  Temp: 98 F (36.7 C)  TempSrc: Oral  Resp: 16  Height: 5' 10"  (1.778 m)  Weight: 199 lb 1.6 oz (90.311 kg)  SpO2: 98%    Body mass index is 28.57 kg/(m^2).  Physical Exam  Constitutional: Patient appears well-developed and well-nourished.No distress.  HEENT: head atraumatic, normocephalic, pupils equal and reactive to light, ears normal TM, neck supple, throat within normal limits Cardiovascular: Normal rate, regular rhythm and normal heart sounds.  No murmur heard. No BLE edema. Pulmonary/Chest: Effort normal diffuse rhonchi and expiratory wheezing Abdominal: Soft.  There is no tenderness. Psychiatric: Patient has a normal mood and affect. behavior is normal. Judgment and thought content normal.  Recent Results (from the past 2160 hour(s))  POCT HgB A1C     Status: Abnormal   Collection Time: 11/11/15 10:51 AM  Result Value Ref Range   Hemoglobin A1C 10.4      PHQ2/9: Depression screen [facility] 2/9 01/03/2016 09/20/2015 06/21/2015  Decreased Interest 0 0 0  Down, Depressed, Hopeless 0 0 0  PHQ - 2 Score 0 0 0     Fall Risk: Fall Risk  01/03/2016 09/20/2015 06/21/2015  Falls in the past year? No No No     Functional Status Survey: Is the patient deaf or have difficulty hearing?: No Does the patient have difficulty seeing, even when  wearing glasses/contacts?: No Does the patient have difficulty concentrating, remembering, or making decisions?: No Does the patient have difficulty walking or climbing stairs?: No Does the patient have difficulty dressing or bathing?: No Does the patient have difficulty doing errands alone such as visiting a doctor's office or shopping?: No    Assessment & Plan  1. 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 He is doing a great job. - glucose blood (ONE TOUCH ULTRA TEST) test strip; Use as instructed  Dispense: 100  each; Refill: 2  2. Bronchitis  - chlorpheniramine-HYDROcodone (TUSSIONEX PENNKINETIC ER) 10-8 MG/5ML SUER; Take 5 mLs by mouth every 12 (twelve) hours as needed.  Dispense: 140 mL; Refill: 0 - Fluticasone Furoate-Vilanterol (BREO ELLIPTA) 100-25 MCG/INH AEPB; Inhale 1 puff into the lungs daily.  Dispense: 60 each; Refill: 0  3. Need for Tdap vaccination  - Tdap vaccine greater than or equal to 7yo IM

## 2016-01-21 ENCOUNTER — Other Ambulatory Visit: Payer: Self-pay

## 2016-01-21 DIAGNOSIS — N521 Erectile dysfunction due to diseases classified elsewhere: Secondary | ICD-10-CM

## 2016-01-21 DIAGNOSIS — E1140 Type 2 diabetes mellitus with diabetic neuropathy, unspecified: Secondary | ICD-10-CM

## 2016-01-21 MED ORDER — INSULIN DEGLUDEC 200 UNIT/ML ~~LOC~~ SOPN: 50.0000 [IU] | PEN_INJECTOR | Freq: Every day | SUBCUTANEOUS | Status: DC

## 2016-01-21 MED ORDER — INSULIN ASPART 100 UNIT/ML ~~LOC~~ SOLN: 20.0000 [IU] | Freq: Three times a day (TID) | SUBCUTANEOUS | Status: DC

## 2016-01-21 NOTE — Telephone Encounter (Signed)
Patient requesting refill. Patient is completely out of Novolog and needs filled today please!

## 2016-01-27 ENCOUNTER — Other Ambulatory Visit: Payer: Self-pay

## 2016-01-27 DIAGNOSIS — N521 Erectile dysfunction due to diseases classified elsewhere: Secondary | ICD-10-CM

## 2016-01-27 DIAGNOSIS — E1140 Type 2 diabetes mellitus with diabetic neuropathy, unspecified: Secondary | ICD-10-CM

## 2016-01-27 DIAGNOSIS — J40 Bronchitis, not specified as acute or chronic: Secondary | ICD-10-CM

## 2016-01-27 MED ORDER — GLUCOSE BLOOD VI STRP: ORAL_STRIP | Status: DC

## 2016-01-27 NOTE — Telephone Encounter (Signed)
Patient requesting refill. 

## 2016-02-28 ENCOUNTER — Ambulatory Visit: Payer: 59 | Admitting: Family Medicine

## 2016-03-03 ENCOUNTER — Ambulatory Visit (INDEPENDENT_AMBULATORY_CARE_PROVIDER_SITE_OTHER): Payer: 59 | Admitting: Family Medicine

## 2016-03-03 ENCOUNTER — Encounter: Payer: Self-pay | Admitting: Family Medicine

## 2016-03-03 VITALS — BP 128/78 | HR 110 | Temp 99.0°F | Resp 16 | Wt 203.5 lb

## 2016-03-03 DIAGNOSIS — J302 Other seasonal allergic rhinitis: Secondary | ICD-10-CM

## 2016-03-03 DIAGNOSIS — I10 Essential (primary) hypertension: Secondary | ICD-10-CM

## 2016-03-03 DIAGNOSIS — E1149 Type 2 diabetes mellitus with other diabetic neurological complication: Secondary | ICD-10-CM | POA: Diagnosis not present

## 2016-03-03 DIAGNOSIS — E7800 Pure hypercholesterolemia, unspecified: Secondary | ICD-10-CM | POA: Diagnosis not present

## 2016-03-03 DIAGNOSIS — N521 Erectile dysfunction due to diseases classified elsewhere: Secondary | ICD-10-CM | POA: Diagnosis not present

## 2016-03-03 DIAGNOSIS — E1140 Type 2 diabetes mellitus with diabetic neuropathy, unspecified: Secondary | ICD-10-CM

## 2016-03-03 LAB — POCT GLYCOSYLATED HEMOGLOBIN (HGB A1C): Hemoglobin A1C: 8.3

## 2016-03-03 MED ORDER — AMLODIPINE BESY-BENAZEPRIL HCL 5-40 MG PO CAPS: 1.0000 | ORAL_CAPSULE | Freq: Two times a day (BID) | ORAL | Status: DC

## 2016-03-03 MED ORDER — AMLODIPINE BESY-BENAZEPRIL HCL 5-40 MG PO CAPS: 1.0000 | ORAL_CAPSULE | Freq: Every day | ORAL | Status: DC

## 2016-03-03 MED ORDER — INSULIN PEN NEEDLE 30G X 8 MM MISC: 1.0000 | Status: DC | PRN

## 2016-03-03 MED ORDER — FLUTICASONE PROPIONATE 50 MCG/ACT NA SUSP: 2.0000 | Freq: Every day | NASAL | Status: DC

## 2016-03-03 MED ORDER — ATORVASTATIN CALCIUM 40 MG PO TABS: 40.0000 mg | ORAL_TABLET | Freq: Every day | ORAL | Status: DC

## 2016-03-03 NOTE — Progress Notes (Signed)
Name: Joseph Johnson   MRN: [id number]    DOB: 06-Mar-1961   Date:03/03/2016       Progress Note  Subjective  Chief Complaint  Chief Complaint  Patient presents with  . Diabetes    patient is here for his 2 month f/u. patient checks it once a day. highest = 271, lowest = 59. patient stated that he had one episode where it dropped. patient stated that he takes all meds as directed.  . Follow-up    patient stated that he has not entirely cleared up from the bronchitis. some wheezing and cough.    HPI  DMII: he has been checking glucose at home more often now.  Fasting is coming down . He had a few low's since last visit, but is was after lunch - advised to adjust pre-lunch dose - go down to 15 units and monitor.  I also discussed glucagon emergency kit and an emergency bracelet. He states he does not think his wife would be able to give him an injection and would like to hold off on Glucagon. He has been compliant with his medication, and states that his balanitis cleared up.  He is doing better with his diet, but there room for improvement. Much more compliant with insulin, and taking to work daily for lunch dose.  HTN: he has been compliant with Lotrel lately. Taking it twice daily again. He denies chest pain or palpitatoin  Hyperlipidemia: he is back on Lipitor,  reviewed labs with him, LDL was 141, and high triglycerides, discussed risk of heart attacks and strokes with his today. No myalgias.   Nasal congestion and mild cough: diagnosed with bronchitis back in Feb, took a Zpack and used Breo, cough has improved, very seldom now, but has noticed some nasal congestion and clear rhinorrhea in am's. Explained post-bronchial cough and that it may take a while to completely resolve. We will try Flonase for nasal symptoms.    Patient Active Problem List   Diagnosis Date Noted  . Allergic rhinitis, seasonal 03/03/2016  . ED (erectile dysfunction) 06/21/2015  . Type II diabetes mellitus with  neuropathy causing erectile dysfunction ([place]) 06/20/2015  . Hypercholesteremia 06/20/2015  . Excess weight 06/20/2015  . Benign essential HTN 02/02/2008    History reviewed. No pertinent past surgical history.  Family History  Problem Relation Age of Onset  . Hypertension Mother   . Diabetes Father   . Diabetes Brother     Social History   Social History  . Marital Status: Married    Spouse Name: N/A  . Number of Children: N/A  . Years of Education: N/A   Occupational History  . Not on file.   Social History Main Topics  . Smoking status: Former Smoker    Types: Cigarettes    Quit date: 09/19/1990  . Smokeless tobacco: Never Used  . Alcohol Use: No  . Drug Use: No  . Sexual Activity:    Partners: Female   Other Topics Concern  . Not on file   Social History Narrative     Current outpatient prescriptions:  .  amLODipine-benazepril (LOTREL) 5-40 MG capsule, Take 1 capsule by mouth daily., Disp: 90 capsule, Rfl: 1 .  aspirin 81 MG tablet, , Disp: , Rfl:  .  atorvastatin (LIPITOR) 40 MG tablet, Take 1 tablet (40 mg total) by mouth daily., Disp: 90 tablet, Rfl: 1 .  insulin aspart (NOVOLOG) 100 UNIT/ML injection, Inject 20 Units into the skin 3 (three) times daily  with meals., Disp: 6 vial, Rfl: 0 .  Insulin Degludec (TRESIBA FLEXTOUCH) 200 UNIT/ML SOPN, Inject 50 Units into the skin daily., Disp: 15 pen, Rfl: 1 .  B-D INS SYRINGE 0.5CC/31GX5/16 31G X 5/16" 0.5 ML MISC, 4 (four) times daily., Disp: , Rfl: 5 .  fluticasone (FLONASE) 50 MCG/ACT nasal spray, Place 2 sprays into both nostrils daily., Disp: 16 g, Rfl: 2 .  glucose blood (ONE TOUCH ULTRA TEST) test strip, Use as instructed, Disp: 100 each, Rfl: 2  No Known Allergies   ROS  Ten systems reviewed and is negative except as mentioned in HPI   Objective  Filed Vitals:   03/03/16 0829  BP: 128/78  Pulse: 110  Temp: 99 F (37.2 C)  TempSrc: Oral  Resp: 16  Weight: 203 lb 8 oz (92.307 kg)  SpO2: 96%     Body mass index is 29.2 kg/(m^2).  Physical Exam  Constitutional: Patient appears well-developed and well-nourished. Obese No distress.  HEENT: head atraumatic, normocephalic, pupils equal and reactive to light,  neck supple, throat within normal limits Cardiovascular: Normal rate, regular rhythm and normal heart sounds.  No murmur heard. No BLE edema. Pulmonary/Chest: Effort normal and breath sounds normal. No respiratory distress. Abdominal: Soft.  There is no tenderness. Psychiatric: Patient has a normal mood and affect. behavior is normal. Judgment and thought content normal.  Recent Results (from the past 2160 hour(s))  POCT glycosylated hemoglobin (Hb A1C)     Status: Abnormal   Collection Time: 03/03/16  8:42 AM  Result Value Ref Range   Hemoglobin A1C 8.3      PHQ2/9: Depression screen [facility] 2/9 03/03/2016 01/03/2016 09/20/2015 06/21/2015  Decreased Interest 0 0 0 0  Down, Depressed, Hopeless 0 0 0 0  PHQ - 2 Score 0 0 0 0    Fall Risk: Fall Risk  03/03/2016 01/03/2016 09/20/2015 06/21/2015  Falls in the past year? No No No No      Functional Status Survey: Is the patient deaf or have difficulty hearing?: No Does the patient have difficulty seeing, even when wearing glasses/contacts?: No Does the patient have difficulty concentrating, remembering, or making decisions?: No Does the patient have difficulty walking or climbing stairs?: No Does the patient have difficulty dressing or bathing?: No Does the patient have difficulty doing errands alone such as visiting a doctor's office or shopping?: No   Assessment & Plan  1. 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 Hypercholesteremia  - atorvastatin (LIPITOR) 40 MG  tablet; Take 1 tablet (40 mg total) by mouth daily.  Dispense: 90 tablet; Refill: 1  3. Benign essential HTN  - amLODipine-benazepril (LOTREL) 5-40 MG capsule; Take 1 capsule by mouth daily.  Dispense: 90 capsule; Refill: 1  4. Allergic rhinitis, seasonal  - fluticasone (FLONASE) 50 MCG/ACT nasal spray; Place 2 sprays into both nostrils daily.  Dispense: 16 g; Refill: 2

## 2016-03-14 ENCOUNTER — Other Ambulatory Visit: Payer: Self-pay | Admitting: Family Medicine

## 2016-03-27 ENCOUNTER — Telehealth: Payer: Self-pay | Admitting: Family Medicine

## 2016-03-27 NOTE — Telephone Encounter (Signed)
Patient is needing refill on insulin syringes to be sent to the [facility] on [address]  Rock NephewPt stated that he is completely out.

## 2016-03-28 ENCOUNTER — Other Ambulatory Visit: Payer: Self-pay | Admitting: Family Medicine

## 2016-04-02 ENCOUNTER — Other Ambulatory Visit: Payer: Self-pay | Admitting: Family Medicine

## 2016-04-02 NOTE — Telephone Encounter (Signed)
Patient requesting refill. 

## 2016-04-08 ENCOUNTER — Other Ambulatory Visit: Payer: Self-pay | Admitting: Family Medicine

## 2016-04-08 NOTE — Telephone Encounter (Signed)
Patient is requesting a 90 day supply at one time and is running low. Please sent into pharmacy.

## 2016-06-12 ENCOUNTER — Ambulatory Visit: Payer: 59 | Admitting: Family Medicine

## 2016-06-17 ENCOUNTER — Ambulatory Visit: Payer: 59 | Admitting: Family Medicine

## 2016-07-16 ENCOUNTER — Encounter: Payer: Self-pay | Admitting: Family Medicine

## 2016-07-16 ENCOUNTER — Ambulatory Visit (INDEPENDENT_AMBULATORY_CARE_PROVIDER_SITE_OTHER): Payer: 59 | Admitting: Family Medicine

## 2016-07-16 VITALS — BP 134/88 | HR 87 | Temp 99.4°F | Resp 16 | Ht 70.0 in | Wt 198.9 lb

## 2016-07-16 DIAGNOSIS — I10 Essential (primary) hypertension: Secondary | ICD-10-CM | POA: Diagnosis not present

## 2016-07-16 DIAGNOSIS — N521 Erectile dysfunction due to diseases classified elsewhere: Secondary | ICD-10-CM | POA: Diagnosis not present

## 2016-07-16 DIAGNOSIS — E1149 Type 2 diabetes mellitus with other diabetic neurological complication: Secondary | ICD-10-CM | POA: Diagnosis not present

## 2016-07-16 DIAGNOSIS — E7800 Pure hypercholesterolemia, unspecified: Secondary | ICD-10-CM | POA: Diagnosis not present

## 2016-07-16 DIAGNOSIS — E1140 Type 2 diabetes mellitus with diabetic neuropathy, unspecified: Secondary | ICD-10-CM

## 2016-07-16 LAB — POCT GLYCOSYLATED HEMOGLOBIN (HGB A1C): Hemoglobin A1C: 9.3

## 2016-07-16 MED ORDER — ATORVASTATIN CALCIUM 40 MG PO TABS: 40.0000 mg | ORAL_TABLET | Freq: Every day | ORAL | 1 refills | Status: DC

## 2016-07-16 MED ORDER — INSULIN ASPART 100 UNIT/ML ~~LOC~~ SOLN: 20.0000 [IU] | Freq: Three times a day (TID) | SUBCUTANEOUS | 1 refills | Status: DC

## 2016-07-16 MED ORDER — LIRAGLUTIDE 18 MG/3ML ~~LOC~~ SOPN
1.8000 mg | PEN_INJECTOR | Freq: Every day | SUBCUTANEOUS | 0 refills | Status: DC
Start: 2016-07-16 — End: 2016-09-07

## 2016-07-16 MED ORDER — AMLODIPINE BESY-BENAZEPRIL HCL 5-40 MG PO CAPS: 1.0000 | ORAL_CAPSULE | Freq: Two times a day (BID) | ORAL | 1 refills | Status: DC

## 2016-07-16 NOTE — Patient Instructions (Signed)
He needs to modify his diet. He will eat breakfast at home and pack his lunch three days a week, he will stop apple juice. He will drink water with Crystal light

## 2016-07-16 NOTE — Progress Notes (Signed)
Name: Joseph Johnson   MRN: [id number]    DOB: 1961-10-17   Date:07/16/2016       Progress Note  Subjective  Chief Complaint  Chief Complaint  Patient presents with  . Medication Refill  . Diabetes    Patient states he does not check his sugar at often as he should and does not recall any of his blood sugar averages  . Hypertension    Headaches just for a couple of days   . Hyperlipidemia    HPI  DMII: he has not been checking his glucose lately. He has as compliant with diet, eating fast food multiple times daily again. He has been skipping insulin prior to lunch.  I also discussed glucagon emergency kit and an emergency bracelet. He states he does not think his wife would be able to give him an injection and would like to hold off on Glucagon. He denies recent episodes of balanitis. ED has improved, no problems recently.   HTN: he has been compliant with Lotrel lately. Taking it twice daily again. He denies chest pain, SOB  or palpitation  Hyperlipidemia: he is back on Lipitor,  reviewed labs with him, LDL was 141, and high triglycerides, discussed risk of heart attacks and strokes with his today. No myalgias.   Patient Active Problem List   Diagnosis Date Noted  . Allergic rhinitis, seasonal 03/03/2016  . ED (erectile dysfunction) 06/21/2015  . 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 Hypercholesteremia 06/20/2015  . Excess weight 06/20/2015  . Benign essential HTN 02/02/2008    History reviewed. No pertinent surgical history.  Family History  Problem Relation Age of Onset  . Hypertension Mother   . Diabetes Father   . Diabetes Brother     Social History   Social History  . Marital status: Married    Spouse name: N/A  . Number of children: N/A  . Years of education: N/A   Occupational History  . Not on file.   Social History Main Topics  . Smoking status: Former Smoker    Types: Cigarettes    Quit date:  09/19/1990  . Smokeless tobacco: Never Used  . Alcohol use No  . Drug use: No  . Sexual activity: Yes    Partners: Female   Other Topics Concern  . Not on file   Social History Narrative  . No narrative on file     Current Outpatient Prescriptions:  .  amLODipine-benazepril (LOTREL) 5-40 MG capsule, Take 1 capsule by mouth 2 (two) times daily., Disp: 180 capsule, Rfl: 1 .  aspirin 81 MG tablet, , Disp: , Rfl:  .  atorvastatin (LIPITOR) 40 MG tablet, Take 1 tablet (40 mg total) by mouth daily., Disp: 90 tablet, Rfl: 1 .  B-D INS SYRINGE 0.5CC/31GX5/16 31G X 5/16" 0.5 ML MISC, USE 4 TIMES A DAY, Disp: 120 each, Rfl: 5 .  glucose blood (ONE TOUCH ULTRA TEST) test strip, Use as instructed, Disp: 100 each, Rfl: 2 .  insulin aspart (NOVOLOG) 100 UNIT/ML injection, Inject 20 Units into the skin 3 (three) times daily with meals., Disp: 60 mL, Rfl: 1 .  Insulin Pen Needle (NOVOFINE) 30G X 8 MM MISC, Inject 10 each into the skin as needed., Disp: 100 each, Rfl: 1 .  Liraglutide (VICTOZA) 18 MG/3ML SOPN, Inject 0.3 mLs (1.8 mg total) into the skin daily., Disp: 6 mL, Rfl: 0  No Known Allergies   ROS  Constitutional: Negative for  fever or weight change.  Respiratory: Negative for cough and shortness of breath.   Cardiovascular: Negative for chest pain or palpitations.  Gastrointestinal: Negative for abdominal pain, no bowel changes.  Musculoskeletal: Negative for gait problem or joint swelling.  Skin: Negative for rash.  Neurological: Negative for dizziness or headache.  No other specific complaints in a complete review of systems (except as listed in HPI above).   Objective  Vitals:   07/16/16 0948  BP: 134/88  Pulse: 87  Resp: 16  Temp: 99.4 F (37.4 C)  TempSrc: Oral  SpO2: 96%  Weight: 198 lb 14.4 oz (90.2 kg)  Height: _0  (1.778 m)    Body mass index is 28.54 kg/m.  Physical Exam  Constitutional: Patient appears well-developed and well-nourished. No distress.   HEENT: head atraumatic, normocephalic, pupils equal and reactive to light,  neck supple, throat within normal limits Cardiovascular: Normal rate, regular rhythm and normal heart sounds.  No murmur heard. No BLE edema. Pulmonary/Chest: Effort normal and breath sounds normal. No respiratory distress. Abdominal: Soft.  There is no tenderness. Psychiatric: Patient has a normal mood and affect. behavior is normal. Judgment and thought content normal.  Recent Results (from the past 2160 hour(s))  POCT HgB A1C     Status: Abnormal   Collection Time: 07/16/16  9:55 AM  Result Value Ref Range   Hemoglobin A1C 9.3      PHQ2/9: Depression screen [facility] 2/9 07/16/2016 03/03/2016 01/03/2016 09/20/2015 06/21/2015  Decreased Interest 0 0 0 0 0  Down, Depressed, Hopeless 0 0 0 0 0  PHQ - 2 Score 0 0 0 0 0    Fall Risk: Fall Risk  07/16/2016 03/03/2016 01/03/2016 09/20/2015 06/21/2015  Falls in the past year? _1     Functional Status Survey: Is the patient deaf or have difficulty hearing?: No Does the patient have difficulty seeing, even when wearing glasses/contacts?: No Does the patient have difficulty concentrating, remembering, or making decisions?: No Does the patient have difficulty walking or climbing stairs?: No Does the patient have difficulty dressing or bathing?: No Does the patient have difficulty doing errands alone such as visiting a doctor's office or shopping?: No    Assessment & Plan  1. 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 He will eat breakfast at home and pack his lunch three days a week, he will stop apple juice. He will drink water with Crystal light - POCT HgB A1C - Liraglutide (VICTOZA) 18 MG/3ML SOPN; Inject 0.3 mLs (1.8 mg total) into the skin daily.  Dispense: 6 mL; Refill: 0 - insulin aspart (NOVOLOG) 100 UNIT/ML injection; Inject 20 Units into the skin 3 (three) times daily with meals.   Dispense: 60 mL; Refill: 1  2. Benign essential HTN  - amLODipine-benazepril (LOTREL) 5-40 MG capsule; Take 1 capsule by mouth 2 (two) times daily.  Dispense: 180 capsule; Refill: 1  3. Hypercholesteremia  - atorvastatin (LIPITOR) 40 MG tablet; Take 1 tablet (40 mg total) by mouth daily.  Dispense: 90 tablet; Refill: 1  4. Erectile dysfunction due to diseases classified elsewhere  Doing well now

## 2016-08-24 ENCOUNTER — Ambulatory Visit: Payer: 59 | Admitting: Family Medicine

## 2016-09-07 ENCOUNTER — Ambulatory Visit (INDEPENDENT_AMBULATORY_CARE_PROVIDER_SITE_OTHER): Payer: 59 | Admitting: Family Medicine

## 2016-09-07 ENCOUNTER — Other Ambulatory Visit: Payer: Self-pay | Admitting: Family Medicine

## 2016-09-07 ENCOUNTER — Encounter: Payer: Self-pay | Admitting: Family Medicine

## 2016-09-07 VITALS — BP 124/84 | HR 94 | Temp 98.2°F | Resp 16 | Ht 70.0 in | Wt 197.3 lb

## 2016-09-07 DIAGNOSIS — Z23 Encounter for immunization: Secondary | ICD-10-CM

## 2016-09-07 DIAGNOSIS — Z9114 Patient's other noncompliance with medication regimen: Secondary | ICD-10-CM | POA: Diagnosis not present

## 2016-09-07 DIAGNOSIS — E1140 Type 2 diabetes mellitus with diabetic neuropathy, unspecified: Secondary | ICD-10-CM

## 2016-09-07 DIAGNOSIS — N521 Erectile dysfunction due to diseases classified elsewhere: Secondary | ICD-10-CM

## 2016-09-07 DIAGNOSIS — E1149 Type 2 diabetes mellitus with other diabetic neurological complication: Secondary | ICD-10-CM

## 2016-09-07 LAB — LIPID PANEL
Cholesterol: 150 mg/dL (ref 125–200)
HDL: 59 mg/dL (ref >=40)
LDL Cholesterol: 73 mg/dL (ref <130)
Total CHOL/HDL Ratio: 2.5 Ratio (ref <=5.0)
Triglycerides: 92 mg/dL (ref <150)
VLDL: 18 mg/dL (ref <30)

## 2016-09-07 LAB — COMPLETE METABOLIC PANEL WITH GFR
ALT: 16 U/L (ref 9–46)
AST: 17 U/L (ref 10–35)
Albumin: 4.1 g/dL (ref 3.6–5.1)
Alkaline Phosphatase: 68 U/L (ref 40–115)
BUN: 15 mg/dL (ref 7–25)
CO2: 28 mmol/L (ref 20–31)
Calcium: 9.3 mg/dL (ref 8.6–10.3)
Chloride: 104 mmol/L (ref 98–110)
Creat: 1.06 mg/dL (ref 0.70–1.33)
GFR, Est African American: >89 mL/min (ref >=60)
GFR, Est Non African American: 80 mL/min (ref >=60)
Glucose, Bld: 178 mg/dL — ABNORMAL HIGH (ref 65–99)
Potassium: 4.4 mmol/L (ref 3.5–5.3)
Sodium: 140 mmol/L (ref 135–146)
Total Bilirubin: 1.4 mg/dL — ABNORMAL HIGH (ref 0.2–1.2)
Total Protein: 6.4 g/dL (ref 6.1–8.1)

## 2016-09-07 MED ORDER — INSULIN DEGLUDEC-LIRAGLUTIDE 100-3.6 UNIT-MG/ML ~~LOC~~ SOPN: 50.0000 [IU] | PEN_INJECTOR | Freq: Every day | SUBCUTANEOUS | 0 refills | Status: DC

## 2016-09-07 MED ORDER — GLUCOSE BLOOD VI STRP: ORAL_STRIP | 2 refills | Status: DC

## 2016-09-07 NOTE — Patient Instructions (Signed)
Use Victoza in the mornings start at 0.6 for the first week, 1.2 second week and after that 1.8 You need to use Tresiba 50 units every day.  Continue novolog before meals  Once out of [place] and Victoza switch to Xultophy at 50 units daily

## 2016-09-07 NOTE — Progress Notes (Signed)
Name: Joseph Johnson   MRN: [id number]    DOB: Mar 25, 1961   Date:09/07/2016       Progress Note  Subjective  Chief Complaint  Chief Complaint  Patient presents with  . Diabetes    pt here for 1 month follow up due to medication change. Pt has not been checking his blood sugars but said that he feels fine    HPI  DMII: he has not been checking his glucose lately. He has as compliant with diet, eating fast food a lot still, but eating more at home now. No longer eating fried food. He has been skipping insulin prior to lunch, he has not been taking [place] for the past 2 weeks , he only used Victoza a couple of times. He states his job has been overwhelming him, it is causing him to skip the insulin before lunch.   Patient Active Problem List   Diagnosis Date Noted  . Allergic rhinitis, seasonal 03/03/2016  . ED (erectile dysfunction) 06/21/2015  . 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 Hypercholesteremia 06/20/2015  . Excess weight 06/20/2015  . Benign essential HTN 02/02/2008    History reviewed. No pertinent surgical history.  Family History  Problem Relation Age of Onset  . Hypertension Mother   . Diabetes Father   . Diabetes Brother     Social History   Social History  . Marital status: Married    Spouse name: N/A  . Number of children: N/A  . Years of education: N/A   Occupational History  . Not on file.   Social History Main Topics  . Smoking status: Former Smoker    Types: Cigarettes    Quit date: 09/19/1990  . Smokeless tobacco: Never Used  . Alcohol use No  . Drug use: No  . Sexual activity: Yes    Partners: Female   Other Topics Concern  . Not on file   Social History Narrative  . No narrative on file     Current Outpatient Prescriptions:  .  amLODipine-benazepril (LOTREL) 5-40 MG capsule, Take 1 capsule by mouth 2 (two) times daily., Disp: 180 capsule, Rfl: 1 .  aspirin 81 MG tablet, ,  Disp: , Rfl:  .  atorvastatin (LIPITOR) 40 MG tablet, Take 1 tablet (40 mg total) by mouth daily., Disp: 90 tablet, Rfl: 1 .  B-D INS SYRINGE 0.5CC/31GX5/16 31G X 5/16" 0.5 ML MISC, USE 4 TIMES A DAY, Disp: 120 each, Rfl: 5 .  glucose blood (ONE TOUCH ULTRA TEST) test strip, Use as instructed, Disp: 100 each, Rfl: 2 .  insulin aspart (NOVOLOG) 100 UNIT/ML injection, Inject 20 Units into the skin 3 (three) times daily with meals., Disp: 60 mL, Rfl: 1 .  Insulin Degludec-Liraglutide (XULTOPHY) 100-3.6 UNIT-MG/ML SOPN, Inject 50 Units into the skin daily., Disp: 9 mL, Rfl: 0 .  Insulin Pen Needle (NOVOFINE) 30G X 8 MM MISC, Inject 10 each into the skin as needed., Disp: 100 each, Rfl: 1  No Known Allergies   ROS  Ten systems reviewed and is negative except as mentioned in HPI   Objective  Vitals:   09/07/16 0940  BP: 124/84  Pulse: 94  Resp: 16  Temp: 98.2 F (36.8 C)  SpO2: 98%  Weight: 197 lb 5 oz (89.5 kg)  Height: 5\' 10"  (1.778 m)    Body mass index is 28.31 kg/m.  Physical Exam  Constitutional: Patient appears well-developed and well-nourished.  No distress.  HEENT:  head atraumatic, normocephalic, pupils equal and reactive to light,neck supple, throat within normal limits Cardiovascular: Normal rate, regular rhythm and normal heart sounds.  No murmur heard. No BLE edema. Pulmonary/Chest: Effort normal and breath sounds normal. No respiratory distress. Abdominal: Soft.  There is no tenderness. Psychiatric: Patient has a normal mood and affect. behavior is normal. Judgment and thought content normal.  Recent Results (from the past 2160 hour(s))  POCT HgB A1C     Status: Abnormal   Collection Time: 07/16/16  9:55 AM  Result Value Ref Range   Hemoglobin A1C 9.3     Diabetic Foot Exam: Diabetic Foot Exam - Simple   Simple Foot Form Diabetic Foot exam was performed with the following findings:  Yes 09/07/2016 10:20 AM  Visual Inspection No deformities, no ulcerations,  no other skin breakdown bilaterally:  Yes Sensation Testing Intact to touch and monofilament testing bilaterally:  Yes Pulse Check Posterior Tibialis and Dorsalis pulse intact bilaterally:  Yes Comments      PHQ2/9: Depression screen [facility] 2/9 09/07/2016 07/16/2016 03/03/2016 01/03/2016 09/20/2015  Decreased Interest 0 0 0 0 0  Down, Depressed, Hopeless 0 0 0 0 0  PHQ - 2 Score 0 0 0 0 0    Fall Risk: Fall Risk  09/07/2016 07/16/2016 03/03/2016 01/03/2016 09/20/2015  Falls in the past year? No No No No No    Functional Status Survey: Is the patient deaf or have difficulty hearing?: No Does the patient have difficulty seeing, even when wearing glasses/contacts?: No Does the patient have difficulty concentrating, remembering, or making decisions?: No Does the patient have difficulty walking or climbing stairs?: No Does the patient have difficulty dressing or bathing?: No Does the patient have difficulty doing errands alone such as visiting a doctor's office or shopping?: No    Assessment & Plan  1. 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  - glucose blood (ONE TOUCH ULTRA TEST) test strip; Use as instructed  Dispense: 100 each; Refill: 2 - Insulin Degludec-Liraglutide (XULTOPHY) 100-3.6 UNIT-MG/ML SOPN; Inject 50 Units into the skin daily.  Dispense: 9 mL; Refill: 0  2. Need for influenza vaccination  - Flu Vaccine QUAD 36+ mos PF IM (Fluarix & Fluzone Quad PF)

## 2016-10-27 ENCOUNTER — Ambulatory Visit: Payer: 59 | Admitting: Family Medicine

## 2016-11-02 ENCOUNTER — Telehealth: Payer: Self-pay | Admitting: Family Medicine

## 2016-11-02 ENCOUNTER — Encounter: Payer: Self-pay | Admitting: Family Medicine

## 2016-11-02 NOTE — Telephone Encounter (Signed)
Patient states due to cost he does not want the 30 day supply of Novolog, he wants a 90 day supply. Informed patient he was supposed to come back for a 6 week F/U from his last visit. But cancelled this appointment and I will inquire to see if Dr. Carlynn PurlSowles will give him a 90 day supply until he comes back for a follow up.

## 2016-11-02 NOTE — Telephone Encounter (Signed)
Pt said that you all called him medication in for his novolog and you all called in the flex pen and that is incorrect and it should be the vials for novo log. [facility] [address] [place] NephewPt said that he needs this today

## 2016-11-03 ENCOUNTER — Other Ambulatory Visit: Payer: Self-pay | Admitting: Family Medicine

## 2016-11-03 NOTE — Telephone Encounter (Signed)
We will give him samples until his follow up appointment next week

## 2016-11-12 ENCOUNTER — Encounter: Payer: Self-pay | Admitting: Family Medicine

## 2016-11-12 ENCOUNTER — Ambulatory Visit (INDEPENDENT_AMBULATORY_CARE_PROVIDER_SITE_OTHER): Payer: 59 | Admitting: Family Medicine

## 2016-11-12 VITALS — BP 128/84 | HR 96 | Temp 98.3°F | Resp 16 | Ht 71.0 in | Wt 206.0 lb

## 2016-11-12 DIAGNOSIS — E1149 Type 2 diabetes mellitus with other diabetic neurological complication: Secondary | ICD-10-CM | POA: Diagnosis not present

## 2016-11-12 DIAGNOSIS — I10 Essential (primary) hypertension: Secondary | ICD-10-CM | POA: Diagnosis not present

## 2016-11-12 DIAGNOSIS — N521 Erectile dysfunction due to diseases classified elsewhere: Secondary | ICD-10-CM

## 2016-11-12 DIAGNOSIS — E7800 Pure hypercholesterolemia, unspecified: Secondary | ICD-10-CM

## 2016-11-12 DIAGNOSIS — J3089 Other allergic rhinitis: Secondary | ICD-10-CM

## 2016-11-12 DIAGNOSIS — E1140 Type 2 diabetes mellitus with diabetic neuropathy, unspecified: Secondary | ICD-10-CM

## 2016-11-12 LAB — POCT GLYCOSYLATED HEMOGLOBIN (HGB A1C): Hemoglobin A1C: 9

## 2016-11-12 MED ORDER — AMLODIPINE BESY-BENAZEPRIL HCL 5-40 MG PO CAPS: 1.0000 | ORAL_CAPSULE | Freq: Every day | ORAL | 1 refills | Status: DC

## 2016-11-12 MED ORDER — LEVOCETIRIZINE DIHYDROCHLORIDE 5 MG PO TABS: 5.0000 mg | ORAL_TABLET | Freq: Every evening | ORAL | 1 refills | Status: DC

## 2016-11-12 MED ORDER — GLUCOSE BLOOD VI STRP: ORAL_STRIP | 2 refills | Status: DC

## 2016-11-12 MED ORDER — LIRAGLUTIDE 18 MG/3ML ~~LOC~~ SOPN: 0.6000 mg | PEN_INJECTOR | Freq: Every day | SUBCUTANEOUS | 1 refills | Status: DC

## 2016-11-12 MED ORDER — FLUTICASONE PROPIONATE 50 MCG/ACT NA SUSP: 2.0000 | Freq: Every day | NASAL | 1 refills | Status: DC

## 2016-11-12 MED ORDER — ATORVASTATIN CALCIUM 40 MG PO TABS: 40.0000 mg | ORAL_TABLET | Freq: Every day | ORAL | 1 refills | Status: DC

## 2016-11-12 NOTE — Progress Notes (Signed)
Name: Joseph Johnson   MRN: [id number]    DOB: 1961-08-26   Date:11/12/2016       Progress Note  Subjective  Chief Complaint  Chief Complaint  Patient presents with  . Diabetes    pt here for 6 week follow up pt stated that his blood sugar hasn't been over 239 in last 6 weeks lowest 54    HPI  DMII: He has been skipping insulin prior to lunch. I also discussed glucagon emergency kit and an emergency bracelet. He states he does not think his wife would be able to give him an injection and would like to hold off on Glucagon.  ED has improved, no problems recently. He tried taking [place] and Victoza, and glucose was well controlled while on combination, however he stopped Victoza because it was causing severe nausea. Discussed staying longer on lower dose of Victoza and eating within 30 minutes of morning shot and he would like to try medication since he noticed improvement on glucose reading. Glucose has gone up since he stopped Victoza , fasting in the 120's, post-prandially up to 190's.   HTN: he has been compliant with Lotrel lately. Taking once a day.  He denies chest pain, SOB  or palpitation  Hyperlipidemia: he is back on Lipitor, denies myalgias  AR: he is having a lot of nasal congestion, no rhinorrhea, some facial pressure. He has not been taking otc medication. No pruritus.    Patient Active Problem List   Diagnosis Date Noted  . Allergic rhinitis, seasonal 03/03/2016  . ED (erectile dysfunction) 06/21/2015  . 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 Hypercholesteremia 06/20/2015  . Excess weight 06/20/2015  . Benign essential HTN 02/02/2008    History reviewed. No pertinent surgical history.  Family History  Problem Relation Age of Onset  . Hypertension Mother   . Diabetes Father   . Diabetes Brother     Social History   Social History  . Marital status: Married    Spouse name: N/A  . Number of children:  N/A  . Years of education: N/A   Occupational History  . Not on file.   Social History Main Topics  . Smoking status: Former Smoker    Types: Cigarettes    Quit date: 09/19/1990  . Smokeless tobacco: Never Used  . Alcohol use No  . Drug use: No  . Sexual activity: Yes    Partners: Female   Other Topics Concern  . Not on file   Social History Narrative  . No narrative on file     Current Outpatient Prescriptions:  .  amLODipine-benazepril (LOTREL) 5-40 MG capsule, Take 1 capsule by mouth daily., Disp: 90 capsule, Rfl: 1 .  aspirin 81 MG tablet, , Disp: , Rfl:  .  atorvastatin (LIPITOR) 40 MG tablet, Take 1 tablet (40 mg total) by mouth daily., Disp: 90 tablet, Rfl: 1 .  B-D INS SYRINGE 0.5CC/31GX5/16 31G X 5/16" 0.5 ML MISC, USE 4 TIMES A DAY, Disp: 120 each, Rfl: 5 .  fluticasone (FLONASE) 50 MCG/ACT nasal spray, Place 2 sprays into both nostrils daily., Disp: 48 g, Rfl: 1 .  glucose blood (ONE TOUCH ULTRA TEST) test strip, Use as instructed, Disp: 100 each, Rfl: 2 .  Insulin Pen Needle (NOVOFINE) 30G X 8 MM MISC, Inject 10 each into the skin as needed., Disp: 100 each, Rfl: 1 .  levocetirizine (XYZAL) 5 MG tablet, Take 1 tablet (5 mg total) by  mouth every evening., Disp: 90 tablet, Rfl: 1 .  liraglutide (VICTOZA) 18 MG/3ML SOPN, Inject 0.1-0.2 mLs (0.6-1.2 mg total) into the skin daily., Disp: 9 mL, Rfl: 1 .  TRESIBA FLEXTOUCH 100 UNIT/ML SOPN FlexTouch Pen, , Disp: , Rfl:   No Known Allergies   ROS  Constitutional: Negative for fever or weight change.  Respiratory: Negative for cough and shortness of breath.   Cardiovascular: Negative for chest pain or palpitations.  Gastrointestinal: Negative for abdominal pain, no bowel changes.  Musculoskeletal: Negative for gait problem or joint swelling.  Skin: Negative for rash.  Neurological: Negative for dizziness or headache.  No other specific complaints in a complete review of systems (except as listed in HPI  above).  Objective  Vitals:   11/12/16 0812  BP: 128/84  Pulse: 96  Resp: 16  Temp: 98.3 F (36.8 C)  TempSrc: Oral  SpO2: 97%  Weight: 206 lb (93.4 kg)  Height: 5' 11"  (1.803 m)    Body mass index is 28.73 kg/m.  Physical Exam  Constitutional: Patient appears well-developed and well-nourished. Obese  No distress.  HEENT: head atraumatic, normocephalic, pupils equal and reactive to light, neck supple, throat within normal limits Cardiovascular: Normal rate, regular rhythm and normal heart sounds.  No murmur heard. No BLE edema. Pulmonary/Chest: Effort normal and breath sounds normal. No respiratory distress. Abdominal: Soft.  There is no tenderness. Psychiatric: Patient has a normal mood and affect. behavior is normal. Judgment and thought content normal.  Recent Results (from the past 2160 hour(s))  COMPLETE METABOLIC PANEL WITH GFR     Status: Abnormal   Collection Time: 09/07/16 10:47 AM  Result Value Ref Range   Sodium 140 135 - 146 mmol/L   Potassium 4.4 3.5 - 5.3 mmol/L   Chloride 104 98 - 110 mmol/L   CO2 28 20 - 31 mmol/L   Glucose, Bld 178 (H) 65 - 99 mg/dL   BUN 15 7 - 25 mg/dL   Creat 1.06 0.70 - 1.33 mg/dL    Comment:   For patients > or = 53 years of age: The upper reference limit for Creatinine is approximately 13% higher for people identified as African-American.      Total Bilirubin 1.4 (H) 0.2 - 1.2 mg/dL   Alkaline Phosphatase 68 40 - 115 U/L   AST 17 10 - 35 U/L   ALT 16 9 - 46 U/L   Total Protein 6.4 6.1 - 8.1 g/dL   Albumin 4.1 3.6 - 5.1 g/dL   Calcium 9.3 8.6 - 10.3 mg/dL   GFR, Est African American >89 >=60 mL/min   GFR, Est Non African American 80 >=60 mL/min  Lipid panel     Status: None   Collection Time: 09/07/16 10:47 AM  Result Value Ref Range   Cholesterol 150 125 - 200 mg/dL   Triglycerides 92 <150 mg/dL   HDL 59 >=40 mg/dL   Total CHOL/HDL Ratio 2.5 <=5.0 Ratio   VLDL 18 <30 mg/dL   LDL Cholesterol 73 <130 mg/dL     Comment:   Total Cholesterol/HDL Ratio:CHD Risk                        Coronary Heart Disease Risk Table                                        Men  Women          1/2 Average Risk              3.4        3.3              Average Risk              5.0        4.4           2X Average Risk              9.6        7.1           3X Average Risk             23.4       11.0 Use the calculated Patient Ratio above and the CHD Risk table  to determine the patient's CHD Risk.   POCT glycosylated hemoglobin (Hb A1C)     Status: Abnormal   Collection Time: 11/12/16  8:51 AM  Result Value Ref Range   Hemoglobin A1C 9.0      PHQ2/9: Depression screen [facility] 2/9 11/12/2016 09/07/2016 07/16/2016 03/03/2016 01/03/2016  Decreased Interest 0 0 0 0 0  Down, Depressed, Hopeless 0 0 0 0 0  PHQ - 2 Score 0 0 0 0 0     Fall Risk: Fall Risk  11/12/2016 09/07/2016 07/16/2016 03/03/2016 01/03/2016  Falls in the past year? No No No No No     Functional Status Survey: Is the patient deaf or have difficulty hearing?: No Does the patient have difficulty seeing, even when wearing glasses/contacts?: No Does the patient have difficulty concentrating, remembering, or making decisions?: No Does the patient have difficulty walking or climbing stairs?: No Does the patient have difficulty dressing or bathing?: No Does the patient have difficulty doing errands alone such as visiting a doctor's office or shopping?: No    Assessment & Plan  1. 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  Dispense: 9 mL; Refill: 1 - POCT glycosylated hemoglobin (Hb A1C)  2. Perennial allergic rhinitis  -  fluticasone (FLONASE) 50 MCG/ACT nasal spray; Place 2 sprays into both nostrils daily.  Dispense: 48 g; Refill: 1 - levocetirizine (XYZAL) 5 MG tablet; Take 1 tablet (5 mg total) by mouth every evening.  Dispense: 90 tablet; Refill: 1  3. Benign essential HTN  - amLODipine-benazepril (LOTREL) 5-40 MG capsule; Take 1 capsule by mouth daily.  Dispense: 90 capsule; Refill: 1  4. Hypercholesteremia  - atorvastatin (LIPITOR) 40 MG tablet; Take 1 tablet (40 mg total) by mouth daily.  Dispense: 90 tablet; Refill: 1

## 2017-01-10 ENCOUNTER — Other Ambulatory Visit: Payer: Self-pay | Admitting: Family Medicine

## 2017-01-10 DIAGNOSIS — E1140 Type 2 diabetes mellitus with diabetic neuropathy, unspecified: Secondary | ICD-10-CM

## 2017-01-10 DIAGNOSIS — N521 Erectile dysfunction due to diseases classified elsewhere: Secondary | ICD-10-CM

## 2017-01-11 DIAGNOSIS — K529 Noninfective gastroenteritis and colitis, unspecified: Secondary | ICD-10-CM | POA: Diagnosis not present

## 2017-01-11 NOTE — Telephone Encounter (Signed)
Patient requesting refill of BD Needles and Victoza to [facility].

## 2017-02-12 ENCOUNTER — Ambulatory Visit: Payer: 59 | Admitting: Family Medicine

## 2017-02-24 ENCOUNTER — Other Ambulatory Visit: Payer: Self-pay | Admitting: Family Medicine

## 2017-02-24 NOTE — Telephone Encounter (Signed)
Patient requesting refill of [place]iba, patient was a no show on last appointment. Was called to set up an appointment and has to come in for a early appointment, was scheduled for April 02, 2017.

## 2017-04-02 ENCOUNTER — Ambulatory Visit (INDEPENDENT_AMBULATORY_CARE_PROVIDER_SITE_OTHER): Payer: 59 | Admitting: Family Medicine

## 2017-04-02 ENCOUNTER — Encounter: Payer: Self-pay | Admitting: Family Medicine

## 2017-04-02 VITALS — BP 142/98 | HR 98 | Temp 98.5°F | Resp 16 | Ht 71.0 in | Wt 205.2 lb

## 2017-04-02 DIAGNOSIS — E7800 Pure hypercholesterolemia, unspecified: Secondary | ICD-10-CM

## 2017-04-02 DIAGNOSIS — N521 Erectile dysfunction due to diseases classified elsewhere: Secondary | ICD-10-CM | POA: Diagnosis not present

## 2017-04-02 DIAGNOSIS — J3089 Other allergic rhinitis: Secondary | ICD-10-CM | POA: Diagnosis not present

## 2017-04-02 DIAGNOSIS — E1165 Type 2 diabetes mellitus with hyperglycemia: Secondary | ICD-10-CM

## 2017-04-02 DIAGNOSIS — E1149 Type 2 diabetes mellitus with other diabetic neurological complication: Secondary | ICD-10-CM | POA: Diagnosis not present

## 2017-04-02 DIAGNOSIS — I10 Essential (primary) hypertension: Secondary | ICD-10-CM | POA: Diagnosis not present

## 2017-04-02 DIAGNOSIS — E1140 Type 2 diabetes mellitus with diabetic neuropathy, unspecified: Secondary | ICD-10-CM

## 2017-04-02 DIAGNOSIS — IMO0002 Reserved for concepts with insufficient information to code with codable children: Secondary | ICD-10-CM

## 2017-04-02 LAB — POCT UA - MICROALBUMIN: Microalbumin Ur, POC: 20 mg/L

## 2017-04-02 LAB — POCT GLYCOSYLATED HEMOGLOBIN (HGB A1C): Hemoglobin A1C: 8.9

## 2017-04-02 MED ORDER — AMLODIPINE BESY-BENAZEPRIL HCL 5-40 MG PO CAPS: 1.0000 | ORAL_CAPSULE | Freq: Every day | ORAL | 1 refills | Status: DC

## 2017-04-02 MED ORDER — INSULIN DEGLUDEC 100 UNIT/ML ~~LOC~~ SOPN: PEN_INJECTOR | SUBCUTANEOUS | 2 refills | Status: DC

## 2017-04-02 MED ORDER — ATORVASTATIN CALCIUM 40 MG PO TABS: 40.0000 mg | ORAL_TABLET | Freq: Every day | ORAL | 1 refills | Status: DC

## 2017-04-02 MED ORDER — INSULIN PEN NEEDLE 31G X 5 MM MISC: 1 refills | Status: DC

## 2017-04-02 MED ORDER — NOVOLOG 100 UNIT/ML ~~LOC~~ SOLN: 15.0000 [IU] | Freq: Three times a day (TID) | SUBCUTANEOUS | 0 refills | Status: DC

## 2017-04-02 MED ORDER — GLUCOSE BLOOD VI STRP: ORAL_STRIP | 2 refills | Status: DC

## 2017-04-02 MED ORDER — LIRAGLUTIDE 18 MG/3ML ~~LOC~~ SOPN: PEN_INJECTOR | SUBCUTANEOUS | 1 refills | Status: DC

## 2017-04-02 NOTE — Patient Instructions (Signed)
Tips for Eating Away From Home If You Have Diabetes Controlling your level of blood glucose, also known as blood sugar, can be challenging. It can be even more difficult when you do not prepare your own meals. The following tips can help you manage your diabetes when you eat away from home. Planning ahead Plan ahead if you know you will be eating away from home:  Ask your health care provider how to time meals and medicine if you are taking insulin.  Make a list of restaurants near you that offer healthy choices. If they have a carry-out menu, take it home and plan what you will order ahead of time.  Look up the restaurant you want to eat at online. Many chain and fast-food restaurants list nutritional information online. Use this information to choose the healthiest options and to calculate how many carbohydrates will be in your meal.  Use a carbohydrate-counting book or mobile app to look up the carbohydrate content and serving size of the foods you want to eat.  Become familiar with serving sizes and learn to recognize how many servings are in a portion. This will allow you to estimate how many carbohydrates you can eat. Free foods A "free food" is any food or drink that has less than 5 g of carbohydrates per serving. Free foods include:  Many vegetables.  Hard boiled eggs.  Nuts or seeds.  Olives.  Cheeses.  Meats. These types of foods make good appetizer choices and are often available at salad bars. Lemon juice, vinegar, or a low-calorie salad dressing of fewer than 20 calories per serving can be used as a "free" salad dressing. Choices to reduce carbohydrates  Substitute nonfat sweetened yogurt with a sugar-free yogurt. Yogurt made from soy milk may also be used, but you will still want a sugar-free or plain option to choose a lower carbohydrate amount.  Ask your server to take away the bread basket or chips from your table.  Order fresh fruit. A salad bar often offers fresh  fruit choices. Avoid canned fruit because it is usually packed in sugar or syrup.  Order a salad, and eat it without dressing. Or, create a "free" salad dressing.  Ask for substitutions. For example, instead of French fries, request an order of a vegetable such as salad, green beans, or broccoli. Other tips  If you take insulin, take the insulin once your food arrives to your table. This will ensure your insulin and food are timed correctly.  Ask your server about the portion size before your order, and ask for a take-out box if the portion has more servings than you should have. When your food comes, leave the amount you should have on the plate, and put the rest in the take-out box.  Consider splitting an entree with someone and ordering a side salad. This information is not intended to replace advice given to you by your health care provider. Make sure you discuss any questions you have with your health care provider. Document Released: 11/23/2005 Document Revised: 04/30/2016 Document Reviewed: 02/20/2014 Elsevier Interactive Patient Education  2017 [employer]  

## 2017-04-02 NOTE — Progress Notes (Signed)
Name: Joseph Johnson   MRN: [id number]    DOB: August 29, 1961   Date:04/02/2017       Progress Note  Subjective  Chief Complaint  Chief Complaint  Patient presents with  . Medication Refill    4 month F/U  . Diabetes    Checks once daily Low-61 Avearge-180's Highest-268  . Hypertension    Denies any symptoms  . Hyperlipidemia  . Allergic Rhinitis     headaches occasionally and sneezing    HPI  DMII: He is not using Novolog prior to lunch, skips about 2-3 doses of Tresiba and Victoza per week. He is also not very compliant with diabetic diet and is not checking glucose multiple times a day, only checking it a few times a week, - explained danger or hypoglycemic episodes and death. I also discussed glucagon emergency kit and an emergency bracelet. He states he does not think his wife would be able to give him an injection and would like to hold off on Glucagon.  ED is stable. hgbA1C is trending down slowly, still not at goal, discussed long term risk and complications of diabetes with patient.   HTN: he has been compliant with Lotrel lately. BP is elevated today. He denies chest pain, SOB or palpitation  Hyperlipidemia: he is back on Lipitor, denies myalgias, last lipid panel was at goal  AR: he has noticed increased in sneezing, and occasional frontal pressure, advised to resume nasal spray   Patient Active Problem List   Diagnosis Date Noted  . Allergic rhinitis, seasonal 03/03/2016  . ED (erectile dysfunction) 06/21/2015  . 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 Hypercholesteremia 06/20/2015  . Excess weight 06/20/2015  . Benign essential HTN 02/02/2008    History reviewed. No pertinent surgical history.  Family History  Problem Relation Age of Onset  . Hypertension Mother   . Diabetes Father   . Diabetes Brother     Social History   Social History  . Marital status: Married    Spouse name: N/A  . Number of  children: N/A  . Years of education: N/A   Occupational History  . Not on file.   Social History Main Topics  . Smoking status: Former Smoker    Types: Cigarettes    Quit date: 09/19/1990  . Smokeless tobacco: Never Used  . Alcohol use No  . Drug use: No  . Sexual activity: Yes    Partners: Female   Other Topics Concern  . Not on file   Social History Narrative  . No narrative on file     Current Outpatient Prescriptions:  .  amLODipine-benazepril (LOTREL) 5-40 MG capsule, Take 1 capsule by mouth daily., Disp: 90 capsule, Rfl: 1 .  aspirin 81 MG tablet, , Disp: , Rfl:  .  atorvastatin (LIPITOR) 40 MG tablet, Take 1 tablet (40 mg total) by mouth daily., Disp: 90 tablet, Rfl: 1 .  B-D INS SYRINGE 0.5CC/31GX5/16 31G X 5/16" 0.5 ML MISC, USE 4 TIMES A DAY, Disp: 120 each, Rfl: 5 .  fluticasone (FLONASE) 50 MCG/ACT nasal spray, Place 2 sprays into both nostrils daily., Disp: 48 g, Rfl: 1 .  glucose blood (ONE TOUCH ULTRA TEST) test strip, Use as instructed, Disp: 100 each, Rfl: 2 .  insulin degludec (TRESIBA FLEXTOUCH) 100 UNIT/ML SOPN FlexTouch Pen, INJECT 50 UNITS INTO THE SKIN DAILY, Disp: 9 mL, Rfl: 2 .  Insulin Pen Needle (B-D UF III MINI PEN NEEDLES) 31G X  5 MM MISC, USE AS DIRECTED. PT STATES 3 TIMES A DAY, Disp: 300 each, Rfl: 1 .  levocetirizine (XYZAL) 5 MG tablet, Take 1 tablet (5 mg total) by mouth every evening., Disp: 90 tablet, Rfl: 1 .  liraglutide (VICTOZA) 18 MG/3ML SOPN, INJECT 1.8MG INTO THE SKIN DAILY, Disp: 27 mL, Rfl: 1 .  NOVOLOG 100 UNIT/ML injection, Inject 15-20 Units into the skin 3 (three) times daily with meals., Disp: 60 mL, Rfl: 0  No Known Allergies   ROS  Constitutional: Negative for fever or weight change.  Respiratory: Negative for cough and shortness of breath.   Cardiovascular: Negative for chest pain or palpitations.  Gastrointestinal: Negative for abdominal pain, no bowel changes.  Musculoskeletal: Negative for gait problem or joint  swelling.  Skin: Negative for rash.  Neurological: Negative for dizziness or headache.  No other specific complaints in a complete review of systems (except as listed in HPI above).  Objective  Vitals:   04/02/17 0815  BP: (!) 142/98  Pulse: 98  Resp: 16  Temp: 98.5 F (36.9 C)  TempSrc: Oral  SpO2: 97%  Weight: 205 lb 3.2 oz (93.1 kg)  Height: _0  (1.803 m)    Body mass index is 28.62 kg/m.  Physical Exam  Constitutional: Patient appears well-developed and well-nourished.  No distress.  HEENT: head atraumatic, normocephalic, pupils equal and reactive to light, neck supple, throat within normal limits Cardiovascular: Normal rate, regular rhythm and normal heart sounds.  No murmur heard. No BLE edema. Pulmonary/Chest: Effort normal and breath sounds normal. No respiratory distress. Abdominal: Soft.  There is no tenderness. Psychiatric: Patient has a normal mood and affect. behavior is normal. Judgment and thought content normal.  Recent Results (from the past 2160 hour(s))  POCT HgB A1C     Status: Abnormal   Collection Time: 04/02/17  8:23 AM  Result Value Ref Range   Hemoglobin A1C 8.9   POCT UA - Microalbumin     Status: Normal   Collection Time: 04/02/17  8:23 AM  Result Value Ref Range   Microalbumin Ur, POC 20 mg/L   Creatinine, POC  mg/dL   Albumin/Creatinine Ratio, Urine, POC      PHQ2/9: Depression screen [facility] 2/9 04/02/2017 11/12/2016 09/07/2016 07/16/2016 03/03/2016  Decreased Interest 0 0 0 0 0  Down, Depressed, Hopeless 0 0 0 0 0  PHQ - 2 Score 0 0 0 0 0     Fall Risk: Fall Risk  04/02/2017 11/12/2016 09/07/2016 07/16/2016 03/03/2016  Falls in the past year? _1     Functional Status Survey: Is the patient deaf or have difficulty hearing?: No Does the patient have difficulty seeing, even when wearing glasses/contacts?: No Does the patient have difficulty concentrating, remembering, or making decisions?: No Does the patient have difficulty  walking or climbing stairs?: No Does the patient have difficulty dressing or bathing?: No Does the patient have difficulty doing errands alone such as visiting a doctor's office or shopping?: No    Assessment & Plan  1. Uncontrolled type 2 diabetes mellitus with neuropathy causing erectile dysfunction ([place])  He refuses to see Endocrinologist, he states he will try to be more compliant with medication and diet, skipping Tresiba, Victoza and insulin a couple times a week. Explained again long term risk of uncontrolled DM - POCT HgB A1C - POCT UA - Microalbumin - liraglutide (VICTOZA) 18 MG/3ML SOPN; INJECT 1.8MG INTO THE SKIN DAILY  Dispense: 27 mL; Refill: 1 - insulin degludec (  TRESIBA FLEXTOUCH) 100 UNIT/ML SOPN FlexTouch Pen; INJECT 50 UNITS INTO THE SKIN DAILY  Dispense: 9 mL; Refill: 2 - NOVOLOG 100 UNIT/ML injection; Inject 15-20 Units into the skin 3 (three) times daily with meals.  Dispense: 60 mL; Refill: 0 - glucose blood (ONE TOUCH ULTRA TEST) test strip; Use as instructed  Dispense: 100 each; Refill: 2 - Insulin Pen Needle (B-D UF III MINI PEN NEEDLES) 31G X 5 MM MISC; USE AS DIRECTED. PT STATES 3 TIMES A DAY  Dispense: 300 each; Refill: 1  2. Benign essential HTN  He states that he has not been taking medication daily, skipped at least 4 doses this week - amLODipine-benazepril (LOTREL) 5-40 MG capsule; Take 1 capsule by mouth daily.  Dispense: 90 capsule; Refill: 1  3. Hypercholesteremia  - atorvastatin (LIPITOR) 40 MG tablet; Take 1 tablet (40 mg total) by mouth daily.  Dispense: 90 tablet; Refill: 1  4. Perennial allergic rhinitis  Taking medication prn , needs to take it daily

## 2017-04-07 ENCOUNTER — Other Ambulatory Visit: Payer: Self-pay | Admitting: Family Medicine

## 2017-04-07 NOTE — Telephone Encounter (Signed)
Patient requesting refill of B-D Syringe to [facility].

## 2017-05-25 ENCOUNTER — Other Ambulatory Visit: Payer: Self-pay | Admitting: Family Medicine

## 2017-05-25 DIAGNOSIS — IMO0002 Reserved for concepts with insufficient information to code with codable children: Secondary | ICD-10-CM

## 2017-05-25 DIAGNOSIS — E1165 Type 2 diabetes mellitus with hyperglycemia: Principal | ICD-10-CM

## 2017-05-25 DIAGNOSIS — N521 Erectile dysfunction due to diseases classified elsewhere: Principal | ICD-10-CM

## 2017-05-25 DIAGNOSIS — E1140 Type 2 diabetes mellitus with diabetic neuropathy, unspecified: Secondary | ICD-10-CM

## 2017-05-25 NOTE — Telephone Encounter (Signed)
Patient requesting refill of Tresiba and Victoza to [facility].

## 2017-06-19 ENCOUNTER — Other Ambulatory Visit: Payer: Self-pay | Admitting: Family Medicine

## 2017-06-19 DIAGNOSIS — J3089 Other allergic rhinitis: Secondary | ICD-10-CM

## 2017-07-07 ENCOUNTER — Other Ambulatory Visit: Payer: Self-pay | Admitting: Family Medicine

## 2017-07-07 ENCOUNTER — Ambulatory Visit (INDEPENDENT_AMBULATORY_CARE_PROVIDER_SITE_OTHER): Payer: 59 | Admitting: Family Medicine

## 2017-07-07 ENCOUNTER — Encounter: Payer: Self-pay | Admitting: Family Medicine

## 2017-07-07 VITALS — BP 132/82 | HR 110 | Temp 98.3°F | Resp 16 | Ht 71.0 in | Wt 196.5 lb

## 2017-07-07 DIAGNOSIS — R0981 Nasal congestion: Secondary | ICD-10-CM

## 2017-07-07 DIAGNOSIS — E1140 Type 2 diabetes mellitus with diabetic neuropathy, unspecified: Secondary | ICD-10-CM

## 2017-07-07 DIAGNOSIS — IMO0002 Reserved for concepts with insufficient information to code with codable children: Secondary | ICD-10-CM

## 2017-07-07 DIAGNOSIS — E7800 Pure hypercholesterolemia, unspecified: Secondary | ICD-10-CM

## 2017-07-07 DIAGNOSIS — I10 Essential (primary) hypertension: Secondary | ICD-10-CM

## 2017-07-07 DIAGNOSIS — E1165 Type 2 diabetes mellitus with hyperglycemia: Secondary | ICD-10-CM | POA: Diagnosis not present

## 2017-07-07 DIAGNOSIS — J3089 Other allergic rhinitis: Secondary | ICD-10-CM

## 2017-07-07 DIAGNOSIS — N521 Erectile dysfunction due to diseases classified elsewhere: Secondary | ICD-10-CM | POA: Diagnosis not present

## 2017-07-07 LAB — LIPID PANEL
Cholesterol: 150 mg/dL (ref <200)
HDL: 47 mg/dL (ref >40)
LDL Cholesterol: 79 mg/dL (ref <100)
Total CHOL/HDL Ratio: 3.2 Ratio (ref <5.0)
Triglycerides: 121 mg/dL (ref <150)
VLDL: 24 mg/dL (ref <30)

## 2017-07-07 LAB — COMPLETE METABOLIC PANEL WITH GFR
ALT: 15 U/L (ref 9–46)
AST: 15 U/L (ref 10–35)
Albumin: 4.1 g/dL (ref 3.6–5.1)
Alkaline Phosphatase: 73 U/L (ref 40–115)
BUN: 15 mg/dL (ref 7–25)
CO2: 27 mmol/L (ref 20–31)
Calcium: 9 mg/dL (ref 8.6–10.3)
Chloride: 105 mmol/L (ref 98–110)
Creat: 1.06 mg/dL (ref 0.70–1.33)
GFR, Est African American: >89 mL/min (ref >=60)
GFR, Est Non African American: 80 mL/min (ref >=60)
Glucose, Bld: 218 mg/dL — ABNORMAL HIGH (ref 65–99)
Potassium: 4.4 mmol/L (ref 3.5–5.3)
Sodium: 140 mmol/L (ref 135–146)
Total Bilirubin: 1.4 mg/dL — ABNORMAL HIGH (ref 0.2–1.2)
Total Protein: 6.4 g/dL (ref 6.1–8.1)

## 2017-07-07 MED ORDER — INSULIN DEGLUDEC 200 UNIT/ML ~~LOC~~ SOPN: 50.0000 [IU] | PEN_INJECTOR | Freq: Every day | SUBCUTANEOUS | 1 refills | Status: DC

## 2017-07-07 MED ORDER — LIRAGLUTIDE 18 MG/3ML ~~LOC~~ SOPN: PEN_INJECTOR | SUBCUTANEOUS | 1 refills | Status: DC

## 2017-07-07 MED ORDER — AMLODIPINE BESY-BENAZEPRIL HCL 5-40 MG PO CAPS: 1.0000 | ORAL_CAPSULE | Freq: Every day | ORAL | 1 refills | Status: DC

## 2017-07-07 MED ORDER — NOVOLOG 100 UNIT/ML ~~LOC~~ SOLN: 2.0000 [IU] | Freq: Three times a day (TID) | SUBCUTANEOUS | 0 refills | Status: DC

## 2017-07-07 MED ORDER — ATORVASTATIN CALCIUM 40 MG PO TABS: 40.0000 mg | ORAL_TABLET | Freq: Every day | ORAL | 1 refills | Status: DC

## 2017-07-07 NOTE — Progress Notes (Signed)
Name: Joseph Johnson   MRN: [id number]    DOB: 1961/04/26   Date:07/07/2017       Progress Note  Subjective  Chief Complaint  Chief Complaint  Patient presents with  . Diabetes    3 month follow up checks daily high 270 low 62  . Hypertension    HPI  DMII:he is doing better he stopped sodas, he states he forgets to take [place] and Victoza once or twice a week, usually on weekends. Going down on Novolog because fasting glucose has been in the 80's. He denies recent episode of hypoglycemia, he states doing better since backing down on Novolog dose. Remided discussed glucagon emergency kit and an emergency bracelet.ED is improving.  He is due for eye exam. He has been out of [place] for the past 4 days. He is feeling better overall. Losing weight 9 lbs since last vist  HTN: he has been compliant with Lotrel lately. BP is elevated today. He denies chest pain, SOB or palpitation  Hyperlipidemia: he is back on Lipitor, denies myalgias, last lipid panel was at goal, we will recheck it today   AR: he has noticed that he cannot blow anything from the right nostril, but no pain or pressure on sinus. No recent sneezing or rhinorrhea. Continue nasal steroid, discussed referral to ENT if pain, but reassurance for now   Patient Active Problem List   Diagnosis Date Noted  . Allergic rhinitis, seasonal 03/03/2016  . ED (erectile dysfunction) 06/21/2015  . 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 Hypercholesteremia 06/20/2015  . Excess weight 06/20/2015  . Benign essential HTN 02/02/2008    No past surgical history on file.  Family History  Problem Relation Age of Onset  . Hypertension Mother   . Diabetes Father   . Diabetes Brother     Social History   Social History  . Marital status: Married    Spouse name: N/A  . Number of children: N/A  . Years of education: N/A   Occupational History  . Not on file.   Social History  Main Topics  . Smoking status: Former Smoker    Types: Cigarettes    Quit date: 09/19/1990  . Smokeless tobacco: Never Used  . Alcohol use No  . Drug use: No  . Sexual activity: Yes    Partners: Female   Other Topics Concern  . Not on file   Social History Narrative  . No narrative on file     Current Outpatient Prescriptions:  .  amLODipine-benazepril (LOTREL) 5-40 MG capsule, Take 1 capsule by mouth daily., Disp: 90 capsule, Rfl: 1 .  aspirin 81 MG tablet, , Disp: , Rfl:  .  atorvastatin (LIPITOR) 40 MG tablet, Take 1 tablet (40 mg total) by mouth daily., Disp: 90 tablet, Rfl: 1 .  B-D INS SYRINGE 0.5CC/31GX5/16 31G X 5/16" 0.5 ML MISC, USE 4 TIMES A DAY, Disp: 200 each, Rfl: 4 .  fluticasone (FLONASE) 50 MCG/ACT nasal spray, PLACE 2 SPRAYS INTO BOTH NOSTRILS DAILY., Disp: 16 g, Rfl: 0 .  glucose blood (ONE TOUCH ULTRA TEST) test strip, Use as instructed, Disp: 100 each, Rfl: 2 .  Insulin Pen Needle (B-D UF III MINI PEN NEEDLES) 31G X 5 MM MISC, USE AS DIRECTED. PT STATES 3 TIMES A DAY, Disp: 300 each, Rfl: 1 .  levocetirizine (XYZAL) 5 MG tablet, TAKE 1 TABLET (5 MG TOTAL) BY MOUTH EVERY EVENING., Disp: 90 tablet, Rfl: 0 .  NOVOLOG 100 UNIT/ML injection, Inject 15-20 Units into the skin 3 (three) times daily with meals., Disp: 60 mL, Rfl: 0 .  VICTOZA 18 MG/3ML SOPN, INJECT 1.8MG INTO THE SKIN DAILY, Disp: 9 mL, Rfl: 0  No Known Allergies   ROS  Constitutional: Negative for fever, positive for  weight change.  Respiratory: Negative for cough and shortness of breath.   Cardiovascular: Negative for chest pain or palpitations.  Gastrointestinal: Negative for abdominal pain, no bowel changes.  Musculoskeletal: Negative for gait problem or joint swelling.  Skin: Negative for rash.  Neurological: Negative for dizziness or headache.  No other specific complaints in a complete review of systems (except as listed in HPI above).  Objective  Vitals:   07/07/17 0813  BP: 132/82   Pulse: (!) 110  Resp: 16  Temp: 98.3 F (36.8 C)  SpO2: 95%  Weight: 196 lb 8 oz (89.1 kg)  Height: 5' 11"  (1.803 m)    Body mass index is 27.41 kg/m.  Physical Exam  Constitutional: Patient appears well-developed and well-nourished.  No distress.  HEENT: head atraumatic, normocephalic, pupils equal and reactive to light,  neck supple, throat within normal limits Cardiovascular: Normal rate, regular rhythm and normal heart sounds.  No murmur heard. No BLE edema. Pulmonary/Chest: Effort normal and breath sounds normal. No respiratory distress. Abdominal: Soft.  There is no tenderness. Psychiatric: Patient has a normal mood and affect. behavior is normal. Judgment and thought content normal.  PHQ2/9: Depression screen [facility] 2/9 04/02/2017 11/12/2016 09/07/2016 07/16/2016 03/03/2016  Decreased Interest 0 0 0 0 0  Down, Depressed, Hopeless 0 0 0 0 0  PHQ - 2 Score 0 0 0 0 0     Fall Risk: Fall Risk  04/02/2017 11/12/2016 09/07/2016 07/16/2016 03/03/2016  Falls in the past year? No No No No No    Assessment & Plan  1. Uncontrolled type 2 diabetes mellitus with neuropathy causing erectile dysfunction ([facility])  - liraglutide (VICTOZA) 18 MG/3ML SOPN; INJECT 1.8MG INTO THE SKIN DAILY  Dispense: 27 mL; Refill: 1 - Insulin Degludec (TRESIBA FLEXTOUCH) 200 UNIT/ML SOPN; Inject 50 Units into the skin daily.  Dispense: 27 mL; Refill: 1 - Hemoglobin A1c - NOVOLOG 100 UNIT/ML injection; Inject 2-15 Units into the skin 3 (three) times daily with meals.  Dispense: 60 mL; Refill: 0  2. Benign essential HTN  - amLODipine-benazepril (LOTREL) 5-40 MG capsule; Take 1 capsule by mouth daily.  Dispense: 90 capsule; Refill: 1 - COMPLETE METABOLIC PANEL WITH GFR  3. Perennial allergic rhinitis  Continue current regiment  4. Hypercholesteremia  - atorvastatin (LIPITOR) 40 MG tablet; Take 1 tablet (40 mg total) by mouth daily.  Dispense: 90 tablet; Refill: 1 - Lipid panel  5. Nasal sinus  congestion

## 2017-07-07 NOTE — Patient Instructions (Signed)
Check fsbs before meals:    Glucose below 120 zero units Glucose between 120-140 - take 2 units of Novolog Glucose between 141-160 - take  4 units of Novolog  Glucose 161-180  - take 6 units of Novolog  Glucose 181-200 - take 8 units of Novolog Above 200 take 8-10 units of Novolog

## 2017-07-08 ENCOUNTER — Other Ambulatory Visit: Payer: Self-pay | Admitting: Family Medicine

## 2017-07-08 DIAGNOSIS — E1165 Type 2 diabetes mellitus with hyperglycemia: Principal | ICD-10-CM

## 2017-07-08 DIAGNOSIS — IMO0002 Reserved for concepts with insufficient information to code with codable children: Secondary | ICD-10-CM

## 2017-07-08 DIAGNOSIS — E7800 Pure hypercholesterolemia, unspecified: Secondary | ICD-10-CM

## 2017-07-08 DIAGNOSIS — N521 Erectile dysfunction due to diseases classified elsewhere: Principal | ICD-10-CM

## 2017-07-08 DIAGNOSIS — E1140 Type 2 diabetes mellitus with diabetic neuropathy, unspecified: Secondary | ICD-10-CM

## 2017-07-08 LAB — HEMOGLOBIN A1C
Hgb A1c MFr Bld: 9.1 % — ABNORMAL HIGH (ref <5.7)
Mean Plasma Glucose: 214 mg/dL

## 2017-08-04 ENCOUNTER — Encounter: Payer: Self-pay | Admitting: Family Medicine

## 2017-08-04 ENCOUNTER — Ambulatory Visit (INDEPENDENT_AMBULATORY_CARE_PROVIDER_SITE_OTHER): Payer: 59 | Admitting: Family Medicine

## 2017-08-04 VITALS — BP 132/82 | HR 100 | Temp 98.2°F | Resp 18 | Ht 71.0 in | Wt 198.1 lb

## 2017-08-04 DIAGNOSIS — R0981 Nasal congestion: Secondary | ICD-10-CM

## 2017-08-04 DIAGNOSIS — J3089 Other allergic rhinitis: Secondary | ICD-10-CM | POA: Diagnosis not present

## 2017-08-04 NOTE — Progress Notes (Signed)
Name: Joseph Johnson   MRN: [id number]    DOB: 26-May-1961   Date:08/04/2017       Progress Note  Subjective  Chief Complaint  Chief Complaint  Patient presents with  . Referral    ENT sinus issues for about 4 months and they are getting worse    HPI  Nasal congestion: patient has a history of allergies, and has been using Flonase and Xyzal, however over the past few months, he states symptoms are much worse, medication stopped working so he stopped it and is currently not using anything. He states he has a loud breathing his nostril, nasal congestion and sometimes feels like cannot breath through his nose at night. He has scheduled an appointment with ENT but is worried because it is not until 09/13. He would like to see them sooner. He snores, he wakes up feeling tired because he is not sleeping well, he does not want a sleep study until he sees ENT. He denies fever, chills or facial pressure.    Patient Active Problem List   Diagnosis Date Noted  . Allergic rhinitis, seasonal 03/03/2016  . ED (erectile dysfunction) 06/21/2015  . 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 Hypercholesteremia 06/20/2015  . Excess weight 06/20/2015  . Benign essential HTN 02/02/2008    No past surgical history on file.  Family History  Problem Relation Age of Onset  . Hypertension Mother   . Diabetes Father   . Diabetes Brother     Social History   Social History  . Marital status: Married    Spouse name: N/A  . Number of children: N/A  . Years of education: N/A   Occupational History  . Not on file.   Social History Main Topics  . Smoking status: Former Smoker    Types: Cigarettes    Quit date: 09/19/1990  . Smokeless tobacco: Never Used  . Alcohol use No  . Drug use: No  . Sexual activity: Yes    Partners: Female   Other Topics Concern  . Not on file   Social History Narrative  . No narrative on file     Current  Outpatient Prescriptions:  .  amLODipine-benazepril (LOTREL) 5-40 MG capsule, Take 1 capsule by mouth daily., Disp: 90 capsule, Rfl: 1 .  aspirin 81 MG tablet, , Disp: , Rfl:  .  atorvastatin (LIPITOR) 40 MG tablet, Take 1 tablet (40 mg total) by mouth daily., Disp: 90 tablet, Rfl: 1 .  B-D INS SYRINGE 0.5CC/31GX5/16 31G X 5/16" 0.5 ML MISC, USE 4 TIMES A DAY, Disp: 200 each, Rfl: 4 .  fluticasone (FLONASE) 50 MCG/ACT nasal spray, PLACE 2 SPRAYS INTO BOTH NOSTRILS DAILY., Disp: 16 g, Rfl: 0 .  glucose blood (ONE TOUCH ULTRA TEST) test strip, Use as instructed, Disp: 100 each, Rfl: 2 .  Insulin Degludec (TRESIBA FLEXTOUCH) 200 UNIT/ML SOPN, Inject 50 Units into the skin daily., Disp: 27 mL, Rfl: 1 .  Insulin Pen Needle (B-D UF III MINI PEN NEEDLES) 31G X 5 MM MISC, USE AS DIRECTED. PT STATES 3 TIMES A DAY, Disp: 300 each, Rfl: 1 .  levocetirizine (XYZAL) 5 MG tablet, TAKE 1 TABLET (5 MG TOTAL) BY MOUTH EVERY EVENING., Disp: 90 tablet, Rfl: 0 .  liraglutide (VICTOZA) 18 MG/3ML SOPN, INJECT 1.8MG  INTO THE SKIN DAILY, Disp: 27 mL, Rfl: 1 .  NOVOLOG 100 UNIT/ML injection, Inject 2-15 Units into the skin 3 (three) times daily with meals., Disp:  60 mL, Rfl: 0  No Known Allergies   ROS  Ten systems reviewed and is negative except as mentioned in HPI   Objective  Vitals:   08/04/17 1049  BP: 132/82  Pulse: 100  Resp: 18  Temp: 98.2 F (36.8 C)  SpO2: 98%  Weight: 198 lb 2 oz (89.9 kg)  Height: 5\' 11"  (1.803 m)    Body mass index is 27.63 kg/m.  Physical Exam  Constitutional: Patient appears well-developed and well-nourished. Overweight.No distress.  HEENT: head atraumatic, normocephalic, pupils equal and reactive to light, ear canal patent, normal TM, neck supple, throat within normal limits, boggy turbinate on right nostril with erythema, normal on left side, no tenderness during palpation of sinus Cardiovascular: Normal rate, regular rhythm and normal heart sounds.  No murmur heard.  No BLE edema. Pulmonary/Chest: Effort normal and breath sounds normal. No respiratory distress. Abdominal: Soft.  There is no tenderness. Psychiatric: Patient has a normal mood and affect. behavior is normal. Judgment and thought content normal.  Recent Results (from the past 2160 hour(s))  COMPLETE METABOLIC PANEL WITH GFR     Status: Abnormal   Collection Time: 07/07/17  9:12 AM  Result Value Ref Range   Sodium 140 135 - 146 mmol/L   Potassium 4.4 3.5 - 5.3 mmol/L   Chloride 105 98 - 110 mmol/L   CO2 27 20 - 31 mmol/L   Glucose, Bld 218 (H) 65 - 99 mg/dL   BUN 15 7 - 25 mg/dL   Creat 1.61 0.96 - 0.45 mg/dL    Comment:   For patients > or = 54 years of age: The upper reference limit for Creatinine is approximately 13% higher for people identified as African-American.      Total Bilirubin 1.4 (H) 0.2 - 1.2 mg/dL   Alkaline Phosphatase 73 40 - 115 U/L   AST 15 10 - 35 U/L   ALT 15 9 - 46 U/L   Total Protein 6.4 6.1 - 8.1 g/dL   Albumin 4.1 3.6 - 5.1 g/dL   Calcium 9.0 8.6 - 40.9 mg/dL   GFR, Est African American >89 >=60 mL/min   GFR, Est Non African American 80 >=60 mL/min  Hemoglobin A1c     Status: Abnormal   Collection Time: 07/07/17  9:12 AM  Result Value Ref Range   Hgb A1c MFr Bld 9.1 (H) <5.7 %    Comment:   For someone without known diabetes, a hemoglobin A1c value of 6.5% or greater indicates that they may have diabetes and this should be confirmed with a follow-up test.   For someone with known diabetes, a value <7% indicates that their diabetes is well controlled and a value greater than or equal to 7% indicates suboptimal control. A1c targets should be individualized based on duration of diabetes, age, comorbid conditions, and other considerations.   Currently, no consensus exists for use of hemoglobin A1c for diagnosis of diabetes for children.      Mean Plasma Glucose 214 mg/dL  Lipid panel     Status: None   Collection Time: 07/07/17  9:12 AM   Result Value Ref Range   Cholesterol 150 <200 mg/dL   Triglycerides 811 <914 mg/dL   HDL 47 >78 mg/dL   Total CHOL/HDL Ratio 3.2 <5.0 Ratio   VLDL 24 <30 mg/dL   LDL Cholesterol 79 <295 mg/dL      AOZ3/0: Depression screen [facility] 2/9 04/02/2017 11/12/2016 09/07/2016 07/16/2016 03/03/2016  Decreased Interest 0 0 0 0 0  Down, Depressed, Hopeless 0 0 0 0 0  PHQ - 2 Score 0 0 0 0 0     Fall Risk: Fall Risk  04/02/2017 11/12/2016 09/07/2016 07/16/2016 03/03/2016  Falls in the past year? No No No No No     Assessment & Plan  1. Nasal congestion  - Ambulatory referral to ENT Advised to try saline spray for now  2. Perennial allergic rhinitis  - Ambulatory referral to ENT

## 2017-08-19 DIAGNOSIS — H6123 Impacted cerumen, bilateral: Secondary | ICD-10-CM | POA: Diagnosis not present

## 2017-08-19 DIAGNOSIS — J301 Allergic rhinitis due to pollen: Secondary | ICD-10-CM | POA: Diagnosis not present

## 2017-08-19 DIAGNOSIS — J343 Hypertrophy of nasal turbinates: Secondary | ICD-10-CM | POA: Diagnosis not present

## 2017-10-02 ENCOUNTER — Other Ambulatory Visit: Payer: Self-pay | Admitting: Family Medicine

## 2017-10-02 DIAGNOSIS — N521 Erectile dysfunction due to diseases classified elsewhere: Principal | ICD-10-CM

## 2017-10-02 DIAGNOSIS — E1165 Type 2 diabetes mellitus with hyperglycemia: Principal | ICD-10-CM

## 2017-10-02 DIAGNOSIS — IMO0002 Reserved for concepts with insufficient information to code with codable children: Secondary | ICD-10-CM

## 2017-10-02 DIAGNOSIS — E1140 Type 2 diabetes mellitus with diabetic neuropathy, unspecified: Secondary | ICD-10-CM

## 2017-10-08 ENCOUNTER — Ambulatory Visit: Payer: 59 | Admitting: Family Medicine

## 2017-10-15 ENCOUNTER — Encounter: Payer: Self-pay | Admitting: Family Medicine

## 2017-10-15 ENCOUNTER — Ambulatory Visit: Payer: 59 | Admitting: Family Medicine

## 2017-10-15 VITALS — BP 142/88 | HR 76 | Temp 98.4°F | Resp 16 | Ht 71.0 in | Wt 203.3 lb

## 2017-10-15 DIAGNOSIS — J3089 Other allergic rhinitis: Secondary | ICD-10-CM

## 2017-10-15 DIAGNOSIS — IMO0002 Reserved for concepts with insufficient information to code with codable children: Secondary | ICD-10-CM

## 2017-10-15 DIAGNOSIS — I10 Essential (primary) hypertension: Secondary | ICD-10-CM

## 2017-10-15 DIAGNOSIS — Z23 Encounter for immunization: Secondary | ICD-10-CM | POA: Diagnosis not present

## 2017-10-15 DIAGNOSIS — E1165 Type 2 diabetes mellitus with hyperglycemia: Secondary | ICD-10-CM

## 2017-10-15 DIAGNOSIS — E1140 Type 2 diabetes mellitus with diabetic neuropathy, unspecified: Secondary | ICD-10-CM

## 2017-10-15 DIAGNOSIS — E7800 Pure hypercholesterolemia, unspecified: Secondary | ICD-10-CM

## 2017-10-15 DIAGNOSIS — N521 Erectile dysfunction due to diseases classified elsewhere: Secondary | ICD-10-CM | POA: Diagnosis not present

## 2017-10-15 LAB — POCT GLYCOSYLATED HEMOGLOBIN (HGB A1C): Hemoglobin A1C: 9.1

## 2017-10-15 MED ORDER — GLUCOSE BLOOD VI STRP: ORAL_STRIP | 2 refills | Status: DC

## 2017-10-15 NOTE — Progress Notes (Signed)
Name: Joseph Johnson   MRN: [id number]    DOB: [birth year]   Date:10/15/2017       Progress Note  Subjective  Chief Complaint  Chief Complaint  Patient presents with  . Diabetes    Checks BS every morning, Lowest-59 Average-95 Highest-170  . Hypertension    Denies any symptoms  . Follow-up    3 month F/U  . Hyperlipidemia  . Allergic Rhinitis     Well controlled, seen ENT doctor for sinus trouble     HPI  DMII:He is taking  [place] and Victoza about 3 times a week. He uses Novolog two to three times a week. Remided discussed glucagon emergency kit and an emergency bracelet.ED is improving.  He is due for eye exam. His hgbA1C has been elevated for a long time, explained importance of seeing endo. Lowest glucose was 59. He states fasting usually 100. He does not usually check glucose after meals.   HTN: he has been compliant with Lotrel lately. BP is elevated today, but usually at goal we will monitor. He denies chest pain, SOB or palpitation  Hyperlipidemia: he is back on Lipitor, denies myalgias, last lipid panel was at goal, discussed ways to improve HDL.  AR: he states he went to ENT on two type of nasal spray and is doing much better, congestion has resolved.    Patient Active Problem List   Diagnosis Date Noted  . Allergic rhinitis, seasonal 03/03/2016  . ED (erectile dysfunction) 06/21/2015  . 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 Hypercholesteremia 06/20/2015  . Excess weight 06/20/2015  . Benign essential HTN 02/02/2008    History reviewed. No pertinent surgical history.  Family History  Problem Relation Age of Onset  . Hypertension Mother   . Diabetes Father   . Diabetes Brother     Social History   Socioeconomic History  . Marital status: Married    Spouse name: Not on file  . Number of children: Not on file  . Years of education: Not on file  . Highest education level: Not on file  Social  Needs  . Financial resource strain: Not on file  . Food insecurity - worry: Not on file  . Food insecurity - inability: Not on file  . Transportation needs - medical: Not on file  . Transportation needs - non-medical: Not on file  Occupational History  . Not on file  Tobacco Use  . Smoking status: Former Smoker    Types: Cigarettes    Last attempt to quit: 09/19/1990    Years since quitting: 27.0  . Smokeless tobacco: Never Used  Substance and Sexual Activity  . Alcohol use: No    Alcohol/week: 0.0 oz  . Drug use: No  . Sexual activity: Yes    Partners: Female  Other Topics Concern  . Not on file  Social History Narrative  . Not on file     Current Outpatient Medications:  .  amLODipine-benazepril (LOTREL) 5-40 MG capsule, Take 1 capsule by mouth daily., Disp: 90 capsule, Rfl: 1 .  aspirin 81 MG tablet, , Disp: , Rfl:  .  B-D INS SYRINGE 0.5CC/31GX5/16 31G X 5/16" 0.5 ML MISC, USE 4 TIMES A DAY, Disp: 200 each, Rfl: 4 .  fluticasone (FLONASE) 50 MCG/ACT nasal spray, PLACE 2 SPRAYS INTO BOTH NOSTRILS DAILY., Disp: 16 g, Rfl: 0 .  glucose blood (ONE TOUCH ULTRA TEST) test strip, Use as instructed, Disp: 100 each, Rfl: 2 .  Insulin Degludec (TRESIBA FLEXTOUCH) 200 UNIT/ML SOPN, Inject 50 Units into the skin daily., Disp: 27 mL, Rfl: 1 .  Insulin Pen Needle (B-D UF III MINI PEN NEEDLES) 31G X 5 MM MISC, USE AS DIRECTED. PT STATES 3 TIMES A DAY, Disp: 300 each, Rfl: 1 .  levocetirizine (XYZAL) 5 MG tablet, TAKE 1 TABLET (5 MG TOTAL) BY MOUTH EVERY EVENING., Disp: 90 tablet, Rfl: 0 .  liraglutide (VICTOZA) 18 MG/3ML SOPN, INJECT 1.8MG INTO THE SKIN DAILY, Disp: 27 mL, Rfl: 1 .  NOVOLOG 100 UNIT/ML injection, INJECT 2-15 UNITS INTO THE SKIN 3 (THREE) TIMES DAILY WITH MEALS., Disp: 60 mL, Rfl: 0 .  atorvastatin (LIPITOR) 40 MG tablet, Take 1 tablet (40 mg total) by mouth daily., Disp: 90 tablet, Rfl: 1  No Known Allergies   ROS  Constitutional: Negative for fever or weight  change.  Respiratory: Negative for cough and shortness of breath.   Cardiovascular: Negative for chest pain or palpitations.  Gastrointestinal: Negative for abdominal pain, no bowel changes.  Musculoskeletal: Negative for gait problem or joint swelling.  Skin: Negative for rash.  Neurological: Negative for dizziness or headache.  No other specific complaints in a complete review of systems (except as listed in HPI above).  Objective  Vitals:   10/15/17 1030  BP: (!) 142/88  Pulse: 76  Resp: 16  Temp: 98.4 F (36.9 C)  TempSrc: Oral  SpO2: 98%  Weight: 203 lb 4.8 oz (92.2 kg)  Height: 5' 11"  (1.803 m)    Body mass index is 28.35 kg/m.  Physical Exam  Constitutional: Patient appears well-developed and well-nourished. Obese  No distress.  HEENT: head atraumatic, normocephalic, pupils equal and reactive to light,  neck supple, throat within normal limits Cardiovascular: Normal rate, regular rhythm and normal heart sounds.  No murmur heard. No BLE edema. Pulmonary/Chest: Effort normal and breath sounds normal. No respiratory distress. Abdominal: Soft.  There is no tenderness. Psychiatric: Patient has a normal mood and affect. behavior is normal. Judgment and thought content normal.  Recent Results (from the past 2160 hour(s))  POCT HgB A1C     Status: Abnormal   Collection Time: 10/15/17 10:36 AM  Result Value Ref Range   Hemoglobin A1C 9.1     Diabetic Foot Exam: Diabetic Foot Exam - Simple   Simple Foot Form Diabetic Foot exam was performed with the following findings:  Yes 10/15/2017 10:58 AM  Visual Inspection No deformities, no ulcerations, no other skin breakdown bilaterally:  Yes Sensation Testing Intact to touch and monofilament testing bilaterally:  Yes Pulse Check Posterior Tibialis and Dorsalis pulse intact bilaterally:  Yes Comments      PHQ2/9: Depression screen [facility] 2/9 10/15/2017 04/02/2017 11/12/2016 09/07/2016 07/16/2016  Decreased Interest 0 0 0 0 0   Down, Depressed, Hopeless 0 0 0 0 0  PHQ - 2 Score 0 0 0 0 0     Fall Risk: Fall Risk  10/15/2017 04/02/2017 11/12/2016 09/07/2016 07/16/2016  Falls in the past year? No No No No No     Functional Status Survey: Is the patient deaf or have difficulty hearing?: No Does the patient have difficulty seeing, even when wearing glasses/contacts?: No Does the patient have difficulty concentrating, remembering, or making decisions?: No Does the patient have difficulty walking or climbing stairs?: No Does the patient have difficulty dressing or bathing?: No Does the patient have difficulty doing errands alone such as visiting a doctor's office or shopping?: No   Assessment & Plan  1.  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 Need for immunization against influenza  refused  3. Perennial allergic rhinitis  Doing well with nasal spray   4. Benign essential HTN  BP slightly elevated today, but usually at goal, continue current regiment   5. Hypercholesteremia   continue statin therapy

## 2018-01-05 ENCOUNTER — Telehealth: Payer: Self-pay

## 2018-01-05 ENCOUNTER — Ambulatory Visit: Payer: Self-pay | Admitting: Internal Medicine

## 2018-01-05 NOTE — Telephone Encounter (Signed)
Copied from CRM [id number]. Topic: Referral - Question >> Jan 05, 2018  9:27 AM Oneal GroutSebastian, Jennifer S wrote: Reason for CRM: Was referred to Endocrinologist in [place], requesting [place] office   [facility]

## 2018-03-02 DIAGNOSIS — E1159 Type 2 diabetes mellitus with other circulatory complications: Secondary | ICD-10-CM | POA: Diagnosis not present

## 2018-03-02 DIAGNOSIS — E1165 Type 2 diabetes mellitus with hyperglycemia: Secondary | ICD-10-CM | POA: Diagnosis not present

## 2018-03-02 DIAGNOSIS — E119 Type 2 diabetes mellitus without complications: Secondary | ICD-10-CM | POA: Diagnosis not present

## 2018-03-02 DIAGNOSIS — E1169 Type 2 diabetes mellitus with other specified complication: Secondary | ICD-10-CM | POA: Diagnosis not present

## 2018-03-11 DIAGNOSIS — Z794 Long term (current) use of insulin: Secondary | ICD-10-CM | POA: Insufficient documentation

## 2018-03-11 DIAGNOSIS — E1165 Type 2 diabetes mellitus with hyperglycemia: Secondary | ICD-10-CM | POA: Insufficient documentation

## 2018-03-24 ENCOUNTER — Other Ambulatory Visit: Payer: Self-pay | Admitting: Family Medicine

## 2018-03-24 DIAGNOSIS — N521 Erectile dysfunction due to diseases classified elsewhere: Principal | ICD-10-CM

## 2018-03-24 DIAGNOSIS — IMO0002 Reserved for concepts with insufficient information to code with codable children: Secondary | ICD-10-CM

## 2018-03-24 DIAGNOSIS — E1165 Type 2 diabetes mellitus with hyperglycemia: Principal | ICD-10-CM

## 2018-03-24 DIAGNOSIS — E1140 Type 2 diabetes mellitus with diabetic neuropathy, unspecified: Secondary | ICD-10-CM

## 2018-04-14 ENCOUNTER — Ambulatory Visit: Payer: 59 | Admitting: Family Medicine

## 2018-04-21 ENCOUNTER — Other Ambulatory Visit: Payer: Self-pay | Admitting: Family Medicine

## 2018-04-21 DIAGNOSIS — N521 Erectile dysfunction due to diseases classified elsewhere: Principal | ICD-10-CM

## 2018-04-21 DIAGNOSIS — IMO0002 Reserved for concepts with insufficient information to code with codable children: Secondary | ICD-10-CM

## 2018-04-21 DIAGNOSIS — E1165 Type 2 diabetes mellitus with hyperglycemia: Principal | ICD-10-CM

## 2018-04-21 DIAGNOSIS — E1140 Type 2 diabetes mellitus with diabetic neuropathy, unspecified: Secondary | ICD-10-CM

## 2018-05-09 ENCOUNTER — Telehealth: Payer: Self-pay | Admitting: Family Medicine

## 2018-05-09 DIAGNOSIS — E1159 Type 2 diabetes mellitus with other circulatory complications: Secondary | ICD-10-CM | POA: Diagnosis not present

## 2018-05-09 DIAGNOSIS — E1165 Type 2 diabetes mellitus with hyperglycemia: Secondary | ICD-10-CM | POA: Diagnosis not present

## 2018-05-09 DIAGNOSIS — E1142 Type 2 diabetes mellitus with diabetic polyneuropathy: Secondary | ICD-10-CM | POA: Diagnosis not present

## 2018-05-09 NOTE — Telephone Encounter (Signed)
I am sorry, but he has not been here since 10/2017. He will need a follow up and I can check labs during his visit.

## 2018-05-09 NOTE — Telephone Encounter (Signed)
Copied from CRM [id number]. Topic: Quick Communication - See Telephone Encounter >> May 09, 2018 12:45 PM Rudi CocoLathan, Seraiah Nowack M, NT wrote: CRM for notification. See Telephone encounter for: 05/09/18.  Pt. Requesting  lab work for insurance (early morning appt.) [phone]

## 2018-05-10 NOTE — Telephone Encounter (Signed)
[phone]  Left voice message on [phone] @ 8:33 informing pt that he will need to schedule an appt. If Dr Carlynn PurlSowles is completely booked then it is okay for him to see Lanora ManisElizabeth.

## 2018-05-17 ENCOUNTER — Ambulatory Visit (INDEPENDENT_AMBULATORY_CARE_PROVIDER_SITE_OTHER): Payer: 59 | Admitting: Nurse Practitioner

## 2018-05-17 ENCOUNTER — Encounter: Payer: Self-pay | Admitting: Nurse Practitioner

## 2018-05-17 VITALS — BP 132/74 | HR 91 | Temp 98.2°F | Resp 18 | Ht 71.0 in | Wt 203.2 lb

## 2018-05-17 DIAGNOSIS — Z5181 Encounter for therapeutic drug level monitoring: Secondary | ICD-10-CM | POA: Diagnosis not present

## 2018-05-17 DIAGNOSIS — E1140 Type 2 diabetes mellitus with diabetic neuropathy, unspecified: Secondary | ICD-10-CM

## 2018-05-17 DIAGNOSIS — E7800 Pure hypercholesterolemia, unspecified: Secondary | ICD-10-CM | POA: Diagnosis not present

## 2018-05-17 DIAGNOSIS — I10 Essential (primary) hypertension: Secondary | ICD-10-CM

## 2018-05-17 DIAGNOSIS — Z0000 Encounter for general adult medical examination without abnormal findings: Secondary | ICD-10-CM

## 2018-05-17 DIAGNOSIS — N521 Erectile dysfunction due to diseases classified elsewhere: Secondary | ICD-10-CM

## 2018-05-17 LAB — COMPLETE METABOLIC PANEL WITH GFR
AG Ratio: 1.8 (calc) (ref 1.0–2.5)
ALT: 14 U/L (ref 9–46)
AST: 17 U/L (ref 10–35)
Albumin: 4.3 g/dL (ref 3.6–5.1)
Alkaline phosphatase (APISO): 69 U/L (ref 40–115)
BUN: 13 mg/dL (ref 7–25)
CO2: 30 mmol/L (ref 20–32)
Calcium: 9.5 mg/dL (ref 8.6–10.3)
Chloride: 103 mmol/L (ref 98–110)
Creat: 1.02 mg/dL (ref 0.70–1.33)
GFR, Est African American: 96 mL/min/{1.73_m2} (ref >=60)
GFR, Est Non African American: 83 mL/min/{1.73_m2} (ref >=60)
Globulin: 2.4 g/dL (calc) (ref 1.9–3.7)
Glucose, Bld: 113 mg/dL — ABNORMAL HIGH (ref 65–99)
Potassium: 4.1 mmol/L (ref 3.5–5.3)
Sodium: 141 mmol/L (ref 135–146)
Total Bilirubin: 1.3 mg/dL — ABNORMAL HIGH (ref 0.2–1.2)
Total Protein: 6.7 g/dL (ref 6.1–8.1)

## 2018-05-17 LAB — LIPID PANEL
Cholesterol: 245 mg/dL — ABNORMAL HIGH (ref <200)
HDL: 50 mg/dL (ref >40)
LDL Cholesterol (Calc): 161 mg/dL (calc) — ABNORMAL HIGH
Non-HDL Cholesterol (Calc): 195 mg/dL (calc) — ABNORMAL HIGH (ref <130)
Total CHOL/HDL Ratio: 4.9 (calc) (ref <5.0)
Triglycerides: 187 mg/dL — ABNORMAL HIGH (ref <150)

## 2018-05-17 NOTE — Patient Instructions (Addendum)
Recommendations: - 64 ounces of water a day - 150 minutes of physical activity weekly -eat two servings of fish weekly -eat one serving of tree nuts ( cashews, pistachios, pecans, almonds.Marland Kitchen.) every other day -eat 6 servings of fruit/vegetables daily and drink plenty of water and avoid sweet beverages.    DASH Eating Plan DASH stands for "Dietary Approaches to Stop Hypertension." The DASH eating plan is a healthy eating plan that has been shown to reduce high blood pressure (hypertension). It may also reduce your risk for type 2 diabetes, heart disease, and stroke. The DASH eating plan may also help with weight loss. What are tips for following this plan? General guidelines  Avoid eating more than 2,300 mg (milligrams) of salt (sodium) a day. If you have hypertension, you may need to reduce your sodium intake to 1,500 mg a day.  Limit alcohol intake to no more than 1 drink a day for nonpregnant women and 2 drinks a day for men. One drink equals 12 oz of beer, 5 oz of wine, or 1 oz of hard liquor.  Work with your health care provider to maintain a healthy body weight or to lose weight. Ask what an ideal weight is for you.  Get at least 30 minutes of exercise that causes your heart to beat faster (aerobic exercise) most days of the week. Activities may include walking, swimming, or biking.  Work with your health care provider or diet and nutrition specialist (dietitian) to adjust your eating plan to your individual calorie needs. Reading food labels  Check food labels for the amount of sodium per serving. Choose foods with less than 5 percent of the Daily Value of sodium. Generally, foods with less than 300 mg of sodium per serving fit into this eating plan.  To find whole grains, look for the word "whole" as the first word in the ingredient list. Shopping  Buy products labeled as "low-sodium" or "no salt added."  Buy fresh foods. Avoid canned foods and premade or frozen  meals. Cooking  Avoid adding salt when cooking. Use salt-free seasonings or herbs instead of table salt or sea salt. Check with your health care provider or pharmacist before using salt substitutes.  Do not fry foods. Cook foods using healthy methods such as baking, boiling, grilling, and broiling instead.  Cook with heart-healthy oils, such as olive, canola, soybean, or sunflower oil. Meal planning   Eat a balanced diet that includes: ? 5 or more servings of fruits and vegetables each day. At each meal, try to fill half of your plate with fruits and vegetables. ? Up to 6-8 servings of whole grains each day. ? Less than 6 oz of lean meat, poultry, or fish each day. A 3-oz serving of meat is about the same size as a deck of cards. One egg equals 1 oz. ? 2 servings of low-fat dairy each day. ? A serving of nuts, seeds, or beans 5 times each week. ? Heart-healthy fats. Healthy fats called Omega-3 fatty acids are found in foods such as flaxseeds and coldwater fish, like sardines, salmon, and mackerel.  Limit how much you eat of the following: ? Canned or prepackaged foods. ? Food that is high in trans fat, such as fried foods. ? Food that is high in saturated fat, such as fatty meat. ? Sweets, desserts, sugary drinks, and other foods with added sugar. ? Full-fat dairy products.  Do not salt foods before eating.  Try to eat at least 2 vegetarian meals  each week.  Eat more home-cooked food and less restaurant, buffet, and fast food.  When eating at a restaurant, ask that your food be prepared with less salt or no salt, if possible. What foods are recommended? The items listed may not be a complete list. Talk with your dietitian about what dietary choices are best for you. Grains Whole-grain or whole-wheat bread. Whole-grain or whole-wheat pasta. Brown rice. [place]. Bulgur. Whole-grain and low-sodium cereals. Pita bread. Low-fat, low-sodium crackers. Whole-wheat flour  tortillas. Vegetables Fresh or frozen vegetables (raw, steamed, roasted, or grilled). Low-sodium or reduced-sodium tomato and vegetable juice. Low-sodium or reduced-sodium tomato sauce and tomato paste. Low-sodium or reduced-sodium canned vegetables. Fruits All fresh, dried, or frozen fruit. Canned fruit in natural juice (without added sugar). Meat and other protein foods Skinless chicken or [place]. Ground chicken or [place]. Pork with fat trimmed off. Fish and seafood. Egg whites. Dried beans, peas, or lentils. Unsalted nuts, nut butters, and seeds. Unsalted canned beans. Lean cuts of beef with fat trimmed off. Low-sodium, lean deli meat. Dairy Low-fat (1%) or fat-free (skim) milk. Fat-free, low-fat, or reduced-fat cheeses. Nonfat, low-sodium ricotta or cottage cheese. Low-fat or nonfat yogurt. Low-fat, low-sodium cheese. Fats and oils Soft margarine without trans fats. Vegetable oil. Low-fat, reduced-fat, or light mayonnaise and salad dressings (reduced-sodium). Canola, safflower, olive, soybean, and sunflower oils. Avocado. Seasoning and other foods Herbs. Spices. Seasoning mixes without salt. Unsalted popcorn and pretzels. Fat-free sweets. What foods are not recommended? The items listed may not be a complete list. Talk with your dietitian about what dietary choices are best for you. Grains Baked goods made with fat, such as croissants, muffins, or some breads. Dry pasta or rice meal packs. Vegetables Creamed or fried vegetables. Vegetables in a cheese sauce. Regular canned vegetables (not low-sodium or reduced-sodium). Regular canned tomato sauce and paste (not low-sodium or reduced-sodium). Regular tomato and vegetable juice (not low-sodium or reduced-sodium). Angie Fava. Olives. Fruits Canned fruit in a light or heavy syrup. Fried fruit. Fruit in cream or butter sauce. Meat and other protein foods Fatty cuts of meat. Ribs. Fried meat. Berniece Salines. Sausage. Bologna and other processed lunch meats.  Salami. Fatback. Hotdogs. Bratwurst. Salted nuts and seeds. Canned beans with added salt. Canned or smoked fish. Whole eggs or egg yolks. Chicken or [place] with skin. Dairy Whole or 2% milk, cream, and half-and-half. Whole or full-fat cream cheese. Whole-fat or sweetened yogurt. Full-fat cheese. Nondairy creamers. Whipped toppings. Processed cheese and cheese spreads. Fats and oils Butter. Stick margarine. Lard. Shortening. Ghee. Bacon fat. Tropical oils, such as coconut, palm kernel, or palm oil. Seasoning and other foods Salted popcorn and pretzels. Onion salt, garlic salt, seasoned salt, table salt, and sea salt. Worcestershire sauce. Tartar sauce. Barbecue sauce. Teriyaki sauce. Soy sauce, including reduced-sodium. Steak sauce. Canned and packaged gravies. Fish sauce. Oyster sauce. Cocktail sauce. Horseradish that you find on the shelf. Ketchup. Mustard. Meat flavorings and tenderizers. Bouillon cubes. Hot sauce and Tabasco sauce. Premade or packaged marinades. Premade or packaged taco seasonings. Relishes. Regular salad dressings. Where to find more information:  National Heart, Lung, and [place]: https://wilson-eaton.com/  American Heart Association: www.heart.org Summary  The DASH eating plan is a healthy eating plan that has been shown to reduce high blood pressure (hypertension). It may also reduce your risk for type 2 diabetes, heart disease, and stroke.  With the DASH eating plan, you should limit salt (sodium) intake to 2,300 mg a day. If you have hypertension, you may need to reduce  your sodium intake to 1,500 mg a day.  When on the DASH eating plan, aim to eat more fresh fruits and vegetables, whole grains, lean proteins, low-fat dairy, and heart-healthy fats.  Work with your health care provider or diet and nutrition specialist (dietitian) to adjust your eating plan to your individual calorie needs. This information is not intended to replace advice given to you by your health  care provider. Make sure you discuss any questions you have with your health care provider. Document Released: 11/12/2011 Document Revised: 11/16/2016 Document Reviewed: 11/16/2016 Elsevier Interactive Patient Education  [employer].

## 2018-05-17 NOTE — Progress Notes (Addendum)
Name: Joseph Johnson   MRN: [id number]    DOB: [birth year]   Date:05/17/2018       Progress Note  Subjective  Chief Complaint  Chief Complaint  Patient presents with  . Annual Exam    HPI  Patient presents for annual CPE.  USPSTF grade A and B recommendations:  Patient sees endocrinologist for Diabetes at the [facility] clinic- last appointment was 6/3. Considering a pump for the future due to difficulty with being outside and traveling a lot with job   Diet: skips lunch most of the time; eats a big breakfast- [employer]aff[employer] with eggs, grits, bacon; when he gets home from work around [employer], occasionally fried foods and has another meal at 9pm. Goes to bed around 10:30. Drinks sparkling water and regular water with rare soda. Getting at least 32 ounces a day. 3 vegetables a week.  Exercise: active job- lots of walking.   Depression:  Depression screen [facility] 2/9 05/17/2018 10/15/2017 04/02/2017 11/12/2016 09/07/2016  Decreased Interest 0 0 0 0 0  Down, Depressed, Hopeless 0 0 0 0 0  PHQ - 2 Score 0 0 0 0 0  Altered sleeping 0 - - - -  Tired, decreased energy 0 - - - -  Change in appetite 0 - - - -  Feeling bad or failure about yourself  0 - - - -  Trouble concentrating 0 - - - -  Moving slowly or fidgety/restless 0 - - - -  Suicidal thoughts 0 - - - -  PHQ-9 Score 0 - - - -    Hypertension:  BP Readings from Last 3 Encounters:  05/17/18 132/74  10/15/17 (!) 142/88  08/04/17 132/82    Obesity: Wt Readings from Last 3 Encounters:  05/17/18 203 lb 3.2 oz (92.2 kg)  10/15/17 203 lb 4.8 oz (92.2 kg)  08/04/17 198 lb 2 oz (89.9 kg)   BMI Readings from Last 3 Encounters:  05/17/18 28.34 kg/m  10/15/17 28.35 kg/m  08/04/17 27.63 kg/m     Lipids:  Lab Results  Component Value Date   CHOL 150 07/07/2017   CHOL 150 09/07/2016   CHOL 249 (H) 08/01/2015   Lab Results  Component Value Date   HDL 47 07/07/2017   HDL 59 09/07/2016   HDL 52 08/01/2015   Lab Results   Component Value Date   LDLCALC 79 07/07/2017   LDLCALC 73 09/07/2016   LDLCALC 144 (H) 08/01/2015   Lab Results  Component Value Date   TRIG 121 07/07/2017   TRIG 92 09/07/2016   TRIG 265 (H) 08/01/2015   Lab Results  Component Value Date   CHOLHDL 3.2 07/07/2017   CHOLHDL 2.5 09/07/2016   CHOLHDL 4.8 08/01/2015   No results found for: LDLDIRECT Glucose:  Glucose  Date Value Ref Range Status  08/01/2015 289 (H) 65 - 99 mg/dL Final   Glucose, Bld  Date Value Ref Range Status  07/07/2017 218 (H) 65 - 99 mg/dL Final  40/98/1191 478 (H) 65 - 99 mg/dL Final    Alcohol: denies  Tobacco use: denies   Married STD testing and prevention (chl/gon/syphilis): declines HIV, hep C: declines   Skin cancer: denies personal, family history. Doesn't use sunscreen Colorectal cancer: no personal/family history. Denies blood in stools. Normal colonscopy 2015  Prostate cancer:  no personal/family history.  Lab Results  Component Value Date   PSA Normal, 0.6 10/18/2014    IPSS Questionnaire (AUA-7): Over the past month.   1)  How often have you had a sensation of not emptying your bladder completely after you finish urinating?  0 - Not at all  2)  How often have you had to urinate again less than two hours after you finished urinating? 1 - Less than 1 time in 5  3)  How often have you found you stopped and started again several times when you urinated?  1 - Less than 1 time in 5  4) How difficult have you found it to postpone urination?  0 - Not at all  5) How often have you had a weak urinary stream?  0 - Not at all  6) How often have you had to push or strain to begin urination?  0 - Not at all  7) How many times did you most typically get up to urinate from the time you went to bed until the time you got up in the morning?  0 - None  Total score:  0-7 mildly symptomatic   8-19 moderately symptomatic   20-35 severely symptomatic   Lung cancer:  Low Dose CT Chest recommended if  Age 6-80 years, 30 pack-year currently smoking OR have quit w/in 15years. Patient does not qualify.   AAA: does not qualify  The USPSTF recommends one-time screening with ultrasonography in men ages 32 to 39 years who have ever smoked  Aspirin: 81 mg 18.9% 10 year risk score of CVA or heart disease   Advanced Care Planning: A voluntary discussion about advance care planning including the explanation and discussion of advance directives.  Discussed health care proxy and Living will, and the patient was able to identify a health care proxy as (Wife) Dewain Platz [phone].  Patient does not have a living will at present time. If patient does have living will, I have requested they bring this to the clinic to be scanned in to their chart.  Patient Active Problem List   Diagnosis Date Noted  . Allergic rhinitis, seasonal 03/03/2016  . ED (erectile dysfunction) 06/21/2015  . 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 Hypercholesteremia 06/20/2015  . Excess weight 06/20/2015  . Benign essential HTN 02/02/2008    No past surgical history on file.  Family History  Problem Relation Age of Onset  . Hypertension Mother   . Diabetes Father   . Diabetes Brother     Social History   Socioeconomic History  . Marital status: Married    Spouse name: Myra   . Number of children: 3  . Years of education: Not on file  . Highest education level: 12th grade  Occupational History  . Not on file  Social Needs  . Financial resource strain: Somewhat hard  . Food insecurity:    Worry: Never true    Inability: Never true  . Transportation needs:    Medical: No    Non-medical: No  Tobacco Use  . Smoking status: Former Smoker    Types: Cigarettes    Last attempt to quit: 09/19/1990    Years since quitting: 27.6  . Smokeless tobacco: Never Used  Substance and Sexual Activity  . Alcohol use: No    Alcohol/week: 0.0 oz  . Drug use: No  . Sexual  activity: Yes    Partners: Female  Lifestyle  . Physical activity:    Days per week: 0 days    Minutes per session: 0 min  . Stress: Not at all  Relationships  . Social connections:  Talks on phone: More than three times a week    Gets together: More than three times a week    Attends religious service: More than 4 times per year    Active member of club or organization: Yes    Attends meetings of clubs or organizations: More than 4 times per year    Relationship status: Married  . Intimate partner violence:    Fear of current or ex partner: No    Emotionally abused: No    Physically abused: No    Forced sexual activity: No  Other Topics Concern  . Not on file  Social History Narrative  . Not on file     Current Outpatient Medications:  .  amLODipine-benazepril (LOTREL) 5-40 MG capsule, Take 1 capsule by mouth daily., Disp: 90 capsule, Rfl: 1 .  aspirin 81 MG tablet, , Disp: , Rfl:  .  atorvastatin (LIPITOR) 40 MG tablet, Take 1 tablet (40 mg total) by mouth daily., Disp: 90 tablet, Rfl: 1 .  B-D INS SYRINGE 0.5CC/31GX5/16 31G X 5/16" 0.5 ML MISC, USE 4 TIMES A DAY, Disp: 200 each, Rfl: 4 .  fluticasone (FLONASE) 50 MCG/ACT nasal spray, PLACE 2 SPRAYS INTO BOTH NOSTRILS DAILY., Disp: 16 g, Rfl: 0 .  glucose blood (ONE TOUCH ULTRA TEST) test strip, Use as instructed, Disp: 100 each, Rfl: 2 .  levocetirizine (XYZAL) 5 MG tablet, TAKE 1 TABLET (5 MG TOTAL) BY MOUTH EVERY EVENING., Disp: 90 tablet, Rfl: 0 .  liraglutide (VICTOZA) 18 MG/3ML SOPN, INJECT 1.8MG  INTO THE SKIN DAILY, Disp: 27 mL, Rfl: 1 .  NOVOLOG 100 UNIT/ML injection, INJECT 2-15 UNITS INTO THE SKIN 3 (THREE) TIMES DAILY WITH MEALS., Disp: 60 mL, Rfl: 0 .  TRESIBA FLEXTOUCH 200 UNIT/ML SOPN, INJECT 50 UNITS INTO THE SKIN DAILY., Disp: 9 mL, Rfl: 0  Not on File   ROS  Constitutional: Negative for fever or weight change.  Respiratory: Negative for cough and shortness of breath.   Cardiovascular: Negative for  chest pain or palpitations.  Gastrointestinal: Negative for abdominal pain, no bowel changes.  Musculoskeletal: Negative for gait problem or joint swelling.  Skin: Negative for rash.  Neurological: Negative for dizziness or headache.  No other specific complaints in a complete review of systems (except as listed in HPI above).  Objective  Vitals:   05/17/18 0839  BP: 132/74  Pulse: 91  Resp: 18  Temp: 98.2 F (36.8 C)  TempSrc: Oral  SpO2: 94%  Weight: 203 lb 3.2 oz (92.2 kg)  Height: 5\' 11"  (1.803 m)    Body mass index is 28.34 kg/m.  Physical Exam  Constitutional: Patient appears well-developed and well-nourished. No distress.  HENT: Head: Normocephalic and atraumatic. Ears: B TMs ok, no erythema or effusion; Nose: Nose normal. Mouth/Throat: Oropharynx is clear and moist. No oropharyngeal exudate.  Eyes: Conjunctivae and EOM are normal. Pupils are equal, round, and reactive to light. No scleral icterus.  Neck: Normal range of motion. Neck supple. No JVD present. No thyromegaly present.  Cardiovascular: Normal rate, regular rhythm and normal heart sounds.  No murmur heard. No BLE edema. Pulmonary/Chest: Effort normal and breath sounds normal. No respiratory distress. Abdominal: Soft. Bowel sounds are normal, no distension. There is no tenderness. Bilateral lower abdominal lipomas palpated- patient sts chronic, unchanged.  MALE GENITALIA: deferred  Musculoskeletal: Normal range of motion, no joint effusions. No gross deformities Neurological: he is alert and oriented to person, place, and time. No cranial nerve deficit. Coordination, balance, strength,  speech and gait are normal.  Skin: Skin is warm and dry. No rash noted. No erythema.  Psychiatric: Patient has a normal mood and affect. behavior is normal. Judgment and thought content normal.  No results found for this or any previous visit (from the past 2160 hour(s)).  Diabetic Foot Exam: Diabetic Foot Exam - Simple    Simple Foot Form Diabetic Foot exam was performed with the following findings:  Yes 05/17/2018  9:40 AM  Visual Inspection No deformities, no ulcerations, no other skin breakdown bilaterally:  Yes Sensation Testing Intact to touch and monofilament testing bilaterally:  Yes Pulse Check Posterior Tibialis and Dorsalis pulse intact bilaterally:  Yes Comments      Fall Risk: Fall Risk  10/15/2017 04/02/2017 11/12/2016 09/07/2016 07/16/2016  Falls in the past year? No No No No No    Assessment & Plan  1. Routine general medical examination at a health care facility - increase water and vegetable intake.  - Lipid Profile - COMPLETE METABOLIC PANEL WITH GFR  2. Benign essential HTN -cont medication management, discussed DASH guidelines  - COMPLETE METABOLIC PANEL WITH GFR  3. 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 Hypercholesteremia - cont medications, discussed diet  - Lipid Profile  5. Medication monitoring encounter - COMPLETE METABOLIC PANEL WITH GFR    -USPSTF grade A and B recommendations reviewed with patient; age-appropriate recommendations, preventive care, screening tests, etc discussed and encouraged; healthy living encouraged; see AVS for patient education given to patient -Discussed importance of 150 minutes of physical activity weekly, eat two servings of fish weekly, eat one serving of tree nuts ( cashews, pistachios, pecans, almonds.Marland Kitchen) every other day, eat 6 servings of fruit/vegetables daily and drink plenty of water and avoid sweet beverages.  -Red flags and when to present for emergency care or RTC including fever >101.74F, chest pain, shortness of breath, new/worsening/un-resolving symptoms,  reviewed with patient at time of visit. Follow up and care instructions discussed and provided in AVS. -Reviewed Health Maintenance: needs eye doctor  appointment  ----------------------------------------- I have reviewed this encounter including the documentation in this note and/or discussed this patient with the provider, Sharyon Cable DNP AGNP-C. I am certifying that I agree with the content of this note as supervising physician. Baruch Gouty, MD [facility] 05/17/2018, 10:05 AM

## 2018-05-18 ENCOUNTER — Other Ambulatory Visit: Payer: Self-pay | Admitting: Nurse Practitioner

## 2018-05-18 DIAGNOSIS — E7800 Pure hypercholesterolemia, unspecified: Secondary | ICD-10-CM

## 2018-05-18 DIAGNOSIS — I10 Essential (primary) hypertension: Secondary | ICD-10-CM

## 2018-05-18 MED ORDER — AMLODIPINE BESY-BENAZEPRIL HCL 5-40 MG PO CAPS: 1.0000 | ORAL_CAPSULE | Freq: Every day | ORAL | 1 refills | Status: DC

## 2018-05-18 MED ORDER — ATORVASTATIN CALCIUM 80 MG PO TABS: 80.0000 mg | ORAL_TABLET | Freq: Every day | ORAL | 1 refills | Status: DC

## 2018-05-30 ENCOUNTER — Other Ambulatory Visit: Payer: Self-pay | Admitting: Family Medicine

## 2018-05-30 DIAGNOSIS — E1140 Type 2 diabetes mellitus with diabetic neuropathy, unspecified: Secondary | ICD-10-CM

## 2018-05-30 DIAGNOSIS — N521 Erectile dysfunction due to diseases classified elsewhere: Principal | ICD-10-CM

## 2018-05-30 DIAGNOSIS — E1165 Type 2 diabetes mellitus with hyperglycemia: Principal | ICD-10-CM

## 2018-05-30 DIAGNOSIS — IMO0002 Reserved for concepts with insufficient information to code with codable children: Secondary | ICD-10-CM

## 2018-05-30 NOTE — Telephone Encounter (Signed)
He missed his follow up, needs to follow up.

## 2018-06-01 NOTE — Telephone Encounter (Signed)
Per Tiffany Dr Tedd SiasSolum is the one that manages the medication that was requested for his DM. Advised the patient that he needed to let pharmacy know that it is not Dr Carlynn PurlSowles that does this. Patient stated that he did not know why they were sending the request for he di dnot need anything.

## 2018-06-03 ENCOUNTER — Telehealth: Payer: Self-pay

## 2018-06-03 NOTE — Telephone Encounter (Signed)
Patient informed he needs to be compliant with his care since his DM is out of control.

## 2018-06-03 NOTE — Telephone Encounter (Signed)
I am sorry. He needs to be compliant with his care

## 2018-06-03 NOTE — Telephone Encounter (Signed)
Copied from CRM [phone]#[id number]. Topic: General - Other >> Jun 02, 2018 10:53 AM Percival SpanishKennedy, Cheryl W wrote:  Pt call to say that he can afford to keep seeing a endocrinologist. He said he need to start back seeing her for his diabetes. Would like a call back

## 2018-06-25 ENCOUNTER — Other Ambulatory Visit: Payer: Self-pay | Admitting: Family Medicine

## 2018-07-04 ENCOUNTER — Encounter: Payer: Self-pay | Admitting: Emergency Medicine

## 2018-07-04 ENCOUNTER — Other Ambulatory Visit: Payer: Self-pay

## 2018-07-04 ENCOUNTER — Emergency Department
Admission: EM | Admit: 2018-07-04 | Discharge: 2018-07-04 | Disposition: A | Discharge status: Home / Self Care | Payer: No Typology Code available for payment source | Attending: Emergency Medicine | Admitting: Emergency Medicine

## 2018-07-04 ENCOUNTER — Emergency Department: Payer: No Typology Code available for payment source

## 2018-07-04 DIAGNOSIS — Z794 Long term (current) use of insulin: Secondary | ICD-10-CM | POA: Insufficient documentation

## 2018-07-04 DIAGNOSIS — E119 Type 2 diabetes mellitus without complications: Secondary | ICD-10-CM | POA: Diagnosis not present

## 2018-07-04 DIAGNOSIS — Z79899 Other long term (current) drug therapy: Secondary | ICD-10-CM | POA: Insufficient documentation

## 2018-07-04 DIAGNOSIS — Y92410 Unspecified street and highway as the place of occurrence of the external cause: Secondary | ICD-10-CM | POA: Diagnosis not present

## 2018-07-04 DIAGNOSIS — Z87891 Personal history of nicotine dependence: Secondary | ICD-10-CM | POA: Insufficient documentation

## 2018-07-04 DIAGNOSIS — I10 Essential (primary) hypertension: Secondary | ICD-10-CM | POA: Insufficient documentation

## 2018-07-04 DIAGNOSIS — M545 Low back pain: Secondary | ICD-10-CM | POA: Diagnosis not present

## 2018-07-04 DIAGNOSIS — Y9389 Activity, other specified: Secondary | ICD-10-CM | POA: Diagnosis not present

## 2018-07-04 DIAGNOSIS — Y999 Unspecified external cause status: Secondary | ICD-10-CM | POA: Insufficient documentation

## 2018-07-04 DIAGNOSIS — S3992XA Unspecified injury of lower back, initial encounter: Secondary | ICD-10-CM | POA: Diagnosis not present

## 2018-07-04 DIAGNOSIS — S39012A Strain of muscle, fascia and tendon of lower back, initial encounter: Secondary | ICD-10-CM | POA: Diagnosis not present

## 2018-07-04 MED ORDER — CYCLOBENZAPRINE HCL 10 MG PO TABS: 10.0000 mg | ORAL_TABLET | Freq: Three times a day (TID) | ORAL | 0 refills | Status: DC | PRN

## 2018-07-04 MED ORDER — IBUPROFEN 800 MG PO TABS: 800.0000 mg | ORAL_TABLET | Freq: Three times a day (TID) | ORAL | 0 refills | Status: DC | PRN
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
## 2018-07-04 NOTE — Discharge Instructions (Signed)
Follow discharge care instruction take medication as directed. °
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
## 2018-07-05 ENCOUNTER — Telehealth: Payer: Self-pay
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
## 2018-07-18 ENCOUNTER — Encounter: Payer: Self-pay | Admitting: Family Medicine

## 2018-07-18 ENCOUNTER — Ambulatory Visit: Payer: 59 | Admitting: Family Medicine

## 2018-07-18 VITALS — BP 118/76 | HR 97 | Temp 98.6°F | Resp 16 | Ht 71.0 in | Wt 201.1 lb

## 2018-07-18 DIAGNOSIS — N521 Erectile dysfunction due to diseases classified elsewhere: Secondary | ICD-10-CM

## 2018-07-18 DIAGNOSIS — E1140 Type 2 diabetes mellitus with diabetic neuropathy, unspecified: Secondary | ICD-10-CM

## 2018-07-18 DIAGNOSIS — E7800 Pure hypercholesterolemia, unspecified: Secondary | ICD-10-CM

## 2018-07-18 DIAGNOSIS — E1165 Type 2 diabetes mellitus with hyperglycemia: Secondary | ICD-10-CM

## 2018-07-18 DIAGNOSIS — I10 Essential (primary) hypertension: Secondary | ICD-10-CM | POA: Diagnosis not present

## 2018-07-18 DIAGNOSIS — IMO0002 Reserved for concepts with insufficient information to code with codable children: Secondary | ICD-10-CM

## 2018-07-18 LAB — POCT UA - MICROALBUMIN: Microalbumin Ur, POC: 20 mg/L

## 2018-07-18 LAB — POCT GLYCOSYLATED HEMOGLOBIN (HGB A1C): Hemoglobin A1C: 8.2 % — AB (ref 4.0–5.6)
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
## 2018-07-18 NOTE — Patient Instructions (Signed)
Call insurance to find out if they pay for Xultophy that way we can combine Guinea-Bissauresiba and Victoza in one shot  Please also find out if they cover Ozempic in place of Victoza since it would be one shot weekly instead of daily

## 2018-09-01 ENCOUNTER — Telehealth: Payer: Self-pay

## 2018-09-01 ENCOUNTER — Other Ambulatory Visit: Payer: Self-pay | Admitting: Family Medicine

## 2018-09-01 DIAGNOSIS — IMO0002 Reserved for concepts with insufficient information to code with codable children: Secondary | ICD-10-CM

## 2018-09-01 DIAGNOSIS — E1165 Type 2 diabetes mellitus with hyperglycemia: Principal | ICD-10-CM

## 2018-09-01 DIAGNOSIS — E1140 Type 2 diabetes mellitus with diabetic neuropathy, unspecified: Secondary | ICD-10-CM

## 2018-09-01 DIAGNOSIS — N521 Erectile dysfunction due to diseases classified elsewhere: Principal | ICD-10-CM

## 2018-09-01 NOTE — Telephone Encounter (Signed)
Request for diabetes medication[facility]   Last office visit pertaining to diabetes: 07/18/2018   Lab Results  Component Value Date   HGBA1C 8.2 (A) 07/18/2018     Follow up on 10/19/2018

## 2018-09-01 NOTE — Telephone Encounter (Signed)
Copied from CRM [id number]. Topic: General - Other >> Sep 01, 2018 10:33 AM Leafy Ro wrote: Reason for CRM:pt would like samples of novolog insulin >> Sep 01, 2018  2:12 PM Phineas Semen, CMA wrote: Patient notified his Novolog was sent into his pharmacy, we do not have samples in office.

## 2018-09-02 ENCOUNTER — Other Ambulatory Visit: Payer: Self-pay | Admitting: Family Medicine

## 2018-09-02 DIAGNOSIS — E1165 Type 2 diabetes mellitus with hyperglycemia: Principal | ICD-10-CM

## 2018-09-02 DIAGNOSIS — N521 Erectile dysfunction due to diseases classified elsewhere: Principal | ICD-10-CM

## 2018-09-02 DIAGNOSIS — E1140 Type 2 diabetes mellitus with diabetic neuropathy, unspecified: Secondary | ICD-10-CM

## 2018-09-02 DIAGNOSIS — IMO0002 Reserved for concepts with insufficient information to code with codable children: Secondary | ICD-10-CM

## 2018-09-05 ENCOUNTER — Other Ambulatory Visit: Payer: Self-pay

## 2018-09-05 DIAGNOSIS — E1140 Type 2 diabetes mellitus with diabetic neuropathy, unspecified: Secondary | ICD-10-CM

## 2018-09-05 DIAGNOSIS — N521 Erectile dysfunction due to diseases classified elsewhere: Principal | ICD-10-CM

## 2018-09-05 DIAGNOSIS — E1165 Type 2 diabetes mellitus with hyperglycemia: Principal | ICD-10-CM

## 2018-09-05 DIAGNOSIS — IMO0002 Reserved for concepts with insufficient information to code with codable children: Secondary | ICD-10-CM

## 2018-09-05 MED ORDER — INSULIN ASPART 100 UNIT/ML ~~LOC~~ SOLN: 20.0000 [IU] | Freq: Three times a day (TID) | SUBCUTANEOUS | 0 refills | Status: DC

## 2018-09-05 NOTE — Telephone Encounter (Signed)
[facility] [place] that [facility] takes Novolog 20 units TID instead of 15 units. Please change and send new prescription.

## 2018-09-16 ENCOUNTER — Other Ambulatory Visit: Payer: Self-pay | Admitting: Family Medicine

## 2018-09-16 DIAGNOSIS — I10 Essential (primary) hypertension: Secondary | ICD-10-CM

## 2018-10-19 ENCOUNTER — Encounter: Payer: Self-pay | Admitting: Family Medicine

## 2018-10-19 ENCOUNTER — Ambulatory Visit: Payer: 59 | Admitting: Family Medicine

## 2018-10-19 VITALS — BP 118/72 | HR 89 | Temp 98.3°F | Resp 16 | Ht 71.0 in | Wt 205.2 lb

## 2018-10-19 DIAGNOSIS — Z23 Encounter for immunization: Secondary | ICD-10-CM

## 2018-10-19 DIAGNOSIS — E1140 Type 2 diabetes mellitus with diabetic neuropathy, unspecified: Secondary | ICD-10-CM | POA: Diagnosis not present

## 2018-10-19 DIAGNOSIS — N521 Erectile dysfunction due to diseases classified elsewhere: Secondary | ICD-10-CM

## 2018-10-19 DIAGNOSIS — J3089 Other allergic rhinitis: Secondary | ICD-10-CM

## 2018-10-19 DIAGNOSIS — E7800 Pure hypercholesterolemia, unspecified: Secondary | ICD-10-CM

## 2018-10-19 DIAGNOSIS — I10 Essential (primary) hypertension: Secondary | ICD-10-CM | POA: Diagnosis not present

## 2018-10-19 DIAGNOSIS — N529 Male erectile dysfunction, unspecified: Secondary | ICD-10-CM

## 2018-10-19 LAB — COMPLETE METABOLIC PANEL WITH GFR
AG Ratio: 1.7 (calc) (ref 1.0–2.5)
ALT: 29 U/L (ref 9–46)
AST: 31 U/L (ref 10–35)
Albumin: 4.3 g/dL (ref 3.6–5.1)
Alkaline phosphatase (APISO): 62 U/L (ref 40–115)
BUN: 24 mg/dL (ref 7–25)
CO2: 29 mmol/L (ref 20–32)
Calcium: 9.9 mg/dL (ref 8.6–10.3)
Chloride: 105 mmol/L (ref 98–110)
Creat: 1.26 mg/dL (ref 0.70–1.33)
GFR, Est African American: 74 mL/min/{1.73_m2} (ref >=60)
GFR, Est Non African American: 64 mL/min/{1.73_m2} (ref >=60)
Globulin: 2.5 g/dL (calc) (ref 1.9–3.7)
Glucose, Bld: 94 mg/dL (ref 65–139)
Potassium: 4.4 mmol/L (ref 3.5–5.3)
Sodium: 142 mmol/L (ref 135–146)
Total Bilirubin: 1.2 mg/dL (ref 0.2–1.2)
Total Protein: 6.8 g/dL (ref 6.1–8.1)

## 2018-10-19 LAB — LIPID PANEL
Cholesterol: 150 mg/dL (ref <200)
HDL: 52 mg/dL (ref >40)
LDL Cholesterol (Calc): 82 mg/dL (calc)
Non-HDL Cholesterol (Calc): 98 mg/dL (calc) (ref <130)
Total CHOL/HDL Ratio: 2.9 (calc) (ref <5.0)
Triglycerides: 78 mg/dL (ref <150)

## 2018-10-19 LAB — POCT GLYCOSYLATED HEMOGLOBIN (HGB A1C): Hemoglobin A1C: 6.8 % — AB (ref 4.0–5.6)

## 2018-10-19 MED ORDER — TADALAFIL 20 MG PO TABS: 20.0000 mg | ORAL_TABLET | Freq: Every day | ORAL | 0 refills | Status: DC | PRN

## 2018-10-19 MED ORDER — ATORVASTATIN CALCIUM 80 MG PO TABS: 80.0000 mg | ORAL_TABLET | Freq: Every day | ORAL | 1 refills | Status: DC

## 2018-10-19 MED ORDER — AMLODIPINE BESY-BENAZEPRIL HCL 5-40 MG PO CAPS: 1.0000 | ORAL_CAPSULE | Freq: Every day | ORAL | 1 refills | Status: DC

## 2018-10-19 MED ORDER — FLUTICASONE PROPIONATE 50 MCG/ACT NA SUSP: 2.0000 | Freq: Every day | NASAL | 0 refills | Status: DC

## 2018-10-19 MED ORDER — INSULIN DEGLUDEC-LIRAGLUTIDE 100-3.6 UNIT-MG/ML ~~LOC~~ SOPN: 50.0000 [IU] | PEN_INJECTOR | Freq: Every day | SUBCUTANEOUS | 2 refills | Status: DC

## 2018-10-19 MED ORDER — EMPAGLIFLOZIN 25 MG PO TABS: 25.0000 mg | ORAL_TABLET | Freq: Every day | ORAL | 1 refills | Status: DC

## 2018-10-19 MED ORDER — INSULIN ASPART 100 UNIT/ML ~~LOC~~ SOLN: 15.0000 [IU] | Freq: Three times a day (TID) | SUBCUTANEOUS | 0 refills | Status: DC

## 2018-10-19 NOTE — Patient Instructions (Signed)
Tula NakayamaXultophy is in place of [place]-Bissauresiba and Victoza ( combo medication) use 50 units once daily and you will get both medications
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
## 2018-10-24 LAB — HM DIABETES EYE EXAM

## 2018-11-07 ENCOUNTER — Encounter: Payer: Self-pay | Admitting: Family Medicine

## 2018-11-14 ENCOUNTER — Other Ambulatory Visit: Payer: Self-pay | Admitting: Family Medicine

## 2018-11-14 DIAGNOSIS — J3089 Other allergic rhinitis: Secondary | ICD-10-CM

## 2018-11-14 NOTE — Telephone Encounter (Signed)
Refill request for general medication. Flonase   Last office visit 10/19/2018   Follow up 02/07/2019

## 2018-11-25 DIAGNOSIS — E113313 Type 2 diabetes mellitus with moderate nonproliferative diabetic retinopathy with macular edema, bilateral: Secondary | ICD-10-CM | POA: Diagnosis not present

## 2018-11-28 ENCOUNTER — Other Ambulatory Visit: Payer: Self-pay | Admitting: Family Medicine

## 2018-11-28 ENCOUNTER — Encounter: Payer: Self-pay | Admitting: Family Medicine

## 2018-11-28 DIAGNOSIS — N521 Erectile dysfunction due to diseases classified elsewhere: Secondary | ICD-10-CM

## 2018-11-28 DIAGNOSIS — E1140 Type 2 diabetes mellitus with diabetic neuropathy, unspecified: Secondary | ICD-10-CM

## 2018-11-28 MED ORDER — INSULIN DEGLUDEC-LIRAGLUTIDE 100-3.6 UNIT-MG/ML ~~LOC~~ SOPN: 50.0000 [IU] | PEN_INJECTOR | Freq: Every day | SUBCUTANEOUS | 0 refills | Status: DC

## 2018-11-28 NOTE — Telephone Encounter (Signed)
[facility] general [facility]. [facility]   Last office visit 10/19/2018   Follow up on 02/07/2019

## 2018-11-28 NOTE — Telephone Encounter (Signed)
Copied from CRM [id number]. Topic: Quick Communication - Rx Refill/Question >> Nov 28, 2018 11:38 AM Baldo DaubAlexander, Amber L wrote: Medication:  Insulin Degludec-Liraglutide (XULTOPHY) 100-3.6 UNIT-MG/ML SOPN  Has the patient contacted their pharmacy? Yes - but this is being refilled as an 18 day supply and it is costing more than pt is comfortable with.  Pt would like to know if a 90 day supply can be called in. (Agent: If no, request that the patient contact the pharmacy for the refill.) (Agent: If yes, when and what did the pharmacy advise?)  Preferred Pharmacy (with phone number or street name):   [id number] [place], [place] - [address] [phone] (Phone) [phone] (Fax)  Agent: Please be advised that RX refills may take up to 3 business days. We ask that you follow-up with your pharmacy.

## 2018-12-22 ENCOUNTER — Other Ambulatory Visit: Payer: Self-pay | Admitting: Family Medicine

## 2018-12-22 DIAGNOSIS — E1140 Type 2 diabetes mellitus with diabetic neuropathy, unspecified: Secondary | ICD-10-CM

## 2018-12-22 DIAGNOSIS — N521 Erectile dysfunction due to diseases classified elsewhere: Secondary | ICD-10-CM

## 2019-01-02 ENCOUNTER — Other Ambulatory Visit: Payer: Self-pay | Admitting: Family Medicine

## 2019-01-02 DIAGNOSIS — E1140 Type 2 diabetes mellitus with diabetic neuropathy, unspecified: Secondary | ICD-10-CM

## 2019-01-02 DIAGNOSIS — N521 Erectile dysfunction due to diseases classified elsewhere: Secondary | ICD-10-CM

## 2019-01-02 MED ORDER — INSULIN DEGLUDEC-LIRAGLUTIDE 100-3.6 UNIT-MG/ML ~~LOC~~ SOPN: 50.0000 [IU] | PEN_INJECTOR | Freq: Every day | SUBCUTANEOUS | 0 refills | Status: DC

## 2019-01-02 NOTE — Telephone Encounter (Signed)
Copied from CRM [id number]. Topic: Quick Communication - Rx Refill/Question >> Jan 02, 2019  9:53 AM Jaquita Rector A wrote: Medication: Insulin Degludec-Liraglutide (XULTOPHY) 100-3.6 UNIT-MG/ML SOPN  Patient request a 90 day supply please  Has the patient contacted their pharmacy? Yes.   (Agent: If no, request that the patient contact the pharmacy for the refill.) (Agent: If yes, when and what did the pharmacy advise?)  Preferred Pharmacy (with phone number or street name): [id number] [place], [place] - [address] [phone] (Phone) [phone] (Fax)    Agent: Please be advised that RX refills may take up to 3 business days. We ask that you follow-up with your pharmacy.

## 2019-02-07 ENCOUNTER — Ambulatory Visit: Payer: 59 | Admitting: Family Medicine

## 2019-03-06 ENCOUNTER — Ambulatory Visit: Payer: 59 | Admitting: Family Medicine

## 2019-03-06 ENCOUNTER — Other Ambulatory Visit: Payer: Self-pay

## 2019-03-06 ENCOUNTER — Encounter: Payer: Self-pay | Admitting: Family Medicine

## 2019-03-06 VITALS — BP 118/64 | HR 71 | Temp 98.0°F | Resp 16 | Ht 71.0 in | Wt 199.7 lb

## 2019-03-06 DIAGNOSIS — E1140 Type 2 diabetes mellitus with diabetic neuropathy, unspecified: Secondary | ICD-10-CM

## 2019-03-06 DIAGNOSIS — N529 Male erectile dysfunction, unspecified: Secondary | ICD-10-CM | POA: Diagnosis not present

## 2019-03-06 DIAGNOSIS — I10 Essential (primary) hypertension: Secondary | ICD-10-CM

## 2019-03-06 DIAGNOSIS — E7800 Pure hypercholesterolemia, unspecified: Secondary | ICD-10-CM | POA: Diagnosis not present

## 2019-03-06 DIAGNOSIS — N521 Erectile dysfunction due to diseases classified elsewhere: Secondary | ICD-10-CM | POA: Diagnosis not present

## 2019-03-06 LAB — POCT GLYCOSYLATED HEMOGLOBIN (HGB A1C): Hemoglobin A1C: 7.4 % — AB (ref 4.0–5.6)

## 2019-03-06 MED ORDER — ATORVASTATIN CALCIUM 80 MG PO TABS: 80.0000 mg | ORAL_TABLET | Freq: Every day | ORAL | 1 refills | Status: DC

## 2019-03-06 MED ORDER — TADALAFIL 20 MG PO TABS: 20.0000 mg | ORAL_TABLET | Freq: Every day | ORAL | 0 refills | Status: DC | PRN

## 2019-03-06 MED ORDER — INSULIN ASPART 100 UNIT/ML ~~LOC~~ SOLN: 2.0000 [IU] | Freq: Three times a day (TID) | SUBCUTANEOUS | 1 refills | Status: DC

## 2019-03-06 MED ORDER — EMPAGLIFLOZIN 25 MG PO TABS: 25.0000 mg | ORAL_TABLET | Freq: Every day | ORAL | 1 refills | Status: DC

## 2019-03-06 MED ORDER — AMLODIPINE BESY-BENAZEPRIL HCL 5-40 MG PO CAPS: 1.0000 | ORAL_CAPSULE | Freq: Every day | ORAL | 1 refills | Status: DC

## 2019-03-06 MED ORDER — INSULIN DEGLUDEC-LIRAGLUTIDE 100-3.6 UNIT-MG/ML ~~LOC~~ SOPN: 50.0000 [IU] | PEN_INJECTOR | Freq: Every day | SUBCUTANEOUS | 0 refills | Status: DC

## 2019-03-06 NOTE — Progress Notes (Signed)
Name: Joseph Johnson   MRN: [id number]    DOB: 1961/10/15   Date:03/06/2019       Progress Note  Subjective  Chief Complaint  Chief Complaint  Patient presents with  . Medication Refill  . Diabetes  . Hypertension  . Hyperlipidemia    HPI  DMII:He saw Dr. Tedd Sias in June of 2019 but because of cost and A1C has improved he is back here for DM management. Last A1C was 6.8% , today is a little higher at 7.4% - he states he has not been as active lately because of COVID-19 ( working from home and not moving as much) he has been compliant with  Jardiance daily , Novolog pre meal by sliding scale before lunch and dinner and Xultophy 50 units daily. He has been checking glucose at home and fasting around 75-105.  He is on ACE and is due for urine micro .Eating healthier, he has not been running lately. He needs refill of Cialis.   HTN: he has been compliant with Lotrel lately. BP istowards low end of normal. No chest pain or palpitationNo dizziness   Hyperlipidemia: lipid panel greatly improved on his last labs, taking medication daily, denies chest pain or myalgia.   AR: doing well at this time.   ED: he states noticed difficulty with having an erection.  Patient Active Problem List   Diagnosis Date Noted  . Type 2 diabetes mellitus with hyperglycemia, with long-term current use of insulin (HCC) 03/11/2018  . Allergic rhinitis, seasonal 03/03/2016  . ED (erectile dysfunction) 06/21/2015  . 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 Hypercholesteremia 06/20/2015  . Excess weight 06/20/2015  . Benign essential HTN 02/02/2008    History reviewed. No pertinent surgical history.  Family History  Problem Relation Age of Onset  . Hypertension Mother   . Diabetes Father   . Diabetes Brother   . Hypercalcemia Brother   . Hypertension Brother     Social History   Socioeconomic History  . Marital status: Married    Spouse name:  Myra   . Number of children: 3  . Years of education: Not on file  . Highest education level: 12th grade  Occupational History  . Not on file  Social Needs  . Financial resource strain: Somewhat hard  . Food insecurity:    Worry: Never true    Inability: Never true  . Transportation needs:    Medical: No    Non-medical: No  Tobacco Use  . Smoking status: Former Smoker    Types: Cigarettes    Last attempt to quit: 09/19/1990    Years since quitting: 28.4  . Smokeless tobacco: Never Used  . Tobacco comment: more than 20 years ago  Substance and Sexual Activity  . Alcohol use: No    Alcohol/week: 0.0 standard drinks  . Drug use: No  . Sexual activity: Yes    Partners: Female  Lifestyle  . Physical activity:    Days per week: 0 days    Minutes per session: 0 min  . Stress: Not at all  Relationships  . Social connections:    Talks on phone: More than three times a week    Gets together: More than three times a week    Attends religious service: More than 4 times per year    Active member of club or organization: Yes    Attends meetings of clubs or organizations: More than 4 times per year  Relationship status: Married  . Intimate partner violence:    Fear of current or ex partner: No    Emotionally abused: No    Physically abused: No    Forced sexual activity: No  Other Topics Concern  . Not on file  Social History Narrative   Patient was in [place] about 2 weeks ago and hurt his lower back. Is under the care of Dr. Anner Crete (Chiropractor)     Current Outpatient Medications:  .  ACCU-CHEK GUIDE test strip, USE AS DIRECTED 3 TIMES A DAY, Disp: , Rfl: 1 .  amLODipine-benazepril (LOTREL) 5-40 MG capsule, Take 1 capsule by mouth daily., Disp: 90 capsule, Rfl: 1 .  aspirin 81 MG tablet, , Disp: , Rfl:  .  atorvastatin (LIPITOR) 80 MG tablet, Take 1 tablet (80 mg total) by mouth daily., Disp: 90 tablet, Rfl: 1 .  BD INSULIN SYRINGE U/F 31G X 5/16" 0.5 ML MISC, USE 4 TIMES A  DAY, Disp: 100 each, Rfl: 1 .  empagliflozin (JARDIANCE) 25 MG TABS tablet, Take 25 mg by mouth daily., Disp: 90 tablet, Rfl: 1 .  fluticasone (FLONASE) 50 MCG/ACT nasal spray, SPRAY 2 SPRAYS INTO EACH NOSTRIL EVERY DAY, Disp: 48 g, Rfl: 0 .  glucose blood (PRECISION QID TEST) test strip, Use 4 (four) times daily Use as instructed., Disp: , Rfl:  .  insulin aspart (NOVOLOG) 100 UNIT/ML injection, Inject 2-15 Units into the skin 3 (three) times daily with meals., Disp: 40 mL, Rfl: 1 .  Insulin Degludec-Liraglutide (XULTOPHY) 100-3.6 UNIT-MG/ML SOPN, Inject 50 Units into the skin daily., Disp: 45 mL, Rfl: 0 .  levocetirizine (XYZAL) 5 MG tablet, TAKE 1 TABLET (5 MG TOTAL) BY MOUTH EVERY EVENING., Disp: 90 tablet, Rfl: 0 .  tadalafil (ADCIRCA/CIALIS) 20 MG tablet, Take 1 tablet (20 mg total) by mouth daily as needed for erectile dysfunction., Disp: 30 tablet, Rfl: 0  No Known Allergies  I personally reviewed active problem list, medication list, allergies, family history, social history, health maintenance with the patient/caregiver today.   ROS  Constitutional: Negative for fever , positive for mild weight change.  Respiratory: Negative for cough and shortness of breath.   Cardiovascular: Negative for chest pain or palpitations.  Gastrointestinal: Negative for abdominal pain, no bowel changes.  Musculoskeletal: Negative for gait problem or joint swelling.  Skin: Negative for rash.  Neurological: Negative for dizziness or headache.  No other specific complaints in a complete review of systems (except as listed in HPI above).  Objective  Vitals:   03/06/19 1206  BP: 118/64  Pulse: 71  Resp: 16  Temp: 98 F (36.7 C)  TempSrc: Oral  Weight: 199 lb 11.2 oz (90.6 kg)  Height:  (1.803 m)    Body mass index is 27.85 kg/m.  Physical Exam  Constitutional: Patient appears well-developed and well-nourished.  No distress.  HEENT: head atraumatic, normocephalic, pupils equal and  reactive to light, neck supple, throat within normal limits Cardiovascular: Normal rate, regular rhythm and normal heart sounds.  No murmur heard. No BLE edema. Pulmonary/Chest: Effort normal and breath sounds normal. No respiratory distress. Abdominal: Soft.  There is no tenderness. Psychiatric: Patient has a normal mood and affect. behavior is normal. Judgment and thought content normal.   Recent Results (from the past 2160 hour(s))  POCT HgB A1C     Status: Abnormal   Collection Time: 03/06/19 12:13 PM  Result Value Ref Range   Hemoglobin A1C 7.4 (A) 4.0 - 5.6 %  HbA1c POC (<> result, manual entry)     HbA1c, POC (prediabetic range)     HbA1c, POC (controlled diabetic range)       PHQ2/9: Depression screen [facility] 2/9 03/06/2019 10/19/2018 07/18/2018 05/17/2018 10/15/2017  Decreased Interest 0 0 0 0 0  Down, Depressed, Hopeless 0 0 0 0 0  PHQ - 2 Score 0 0 0 0 0  Altered sleeping 0 0 0 0 -  Tired, decreased energy 0 0 0 0 -  Change in appetite 0 0 0 0 -  Feeling bad or failure about yourself  0 0 0 0 -  Trouble concentrating 0 0 0 0 -  Moving slowly or fidgety/restless 0 0 0 0 -  Suicidal thoughts 0 0 0 0 -  PHQ-9 Score 0 0 0 0 -  Difficult doing work/chores Not difficult at all Not difficult at all Not difficult at all - -    phq 9 is negative   Fall Risk: Fall Risk  03/06/2019 10/19/2018 07/18/2018 10/15/2017 04/02/2017  Falls in the past year? 0 0 No No No  Number falls in past yr: 0 - - - -  Injury with Fall? 0 - - - -    Functional Status Survey: Is the patient deaf or have difficulty hearing?: No Does the patient have difficulty seeing, even when wearing glasses/contacts?: Yes Does the patient have difficulty concentrating, remembering, or making decisions?: No Does the patient have difficulty walking or climbing stairs?: No Does the patient have difficulty dressing or bathing?: No Does the patient have difficulty doing errands alone such as visiting a doctor's office  or shopping?: No   Assessment & Plan   1. 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  Dispense: 90 tablet; Refill: 1 - Insulin Degludec-Liraglutide (XULTOPHY) 100-3.6 UNIT-MG/ML SOPN; Inject 50 Units into the skin daily.  Dispense: 45 mL; Refill: 0 - insulin aspart (NOVOLOG) 100 UNIT/ML injection; Inject 2-15 Units into the skin 3 (three) times daily with meals.  Dispense: 40 mL; Refill: 1 - tadalafil (ADCIRCA/CIALIS) 20 MG tablet; Take 1 tablet (20 mg total) by mouth daily as needed for erectile dysfunction.  Dispense: 30 tablet; Refill: 0  2. Benign essential HTN  - amLODipine-benazepril (LOTREL) 5-40 MG capsule; Take 1 capsule by mouth daily.  Dispense: 90 capsule; Refill: 1  3. Hypercholesteremia  - atorvastatin (LIPITOR) 80 MG tablet; Take 1 tablet (80 mg total) by mouth daily.  Dispense: 90 tablet; Refill: 1  4. ED (erectile dysfunction) of organic origin  - tadalafil (ADCIRCA/CIALIS) 20 MG tablet; Take 1 tablet (20 mg total) by mouth daily as needed for erectile dysfunction.  Dispense: 30 tablet; Refill: 0

## 2019-05-15 ENCOUNTER — Other Ambulatory Visit: Payer: Self-pay | Admitting: Family Medicine

## 2019-05-15 ENCOUNTER — Telehealth: Payer: Self-pay | Admitting: Family Medicine

## 2019-05-15 NOTE — Telephone Encounter (Signed)
Copied from [place] [id number]. Topic: Quick Communication - Rx Refill/Question >> May 15, 2019  3:01 PM Erick Blinks wrote: Medication: BD INSULIN SYRINGE U/F 31G X 5/16" 0.5 ML MISC [[id number]] + ACCU-CHEK GUIDE test strip [[id number]] -90 day supply  Has the patient contacted their pharmacy? Yes  (Agent: If no, request that the patient contact the pharmacy for the refill.) (Agent: If yes, when and what did the pharmacy advise?)  Preferred Pharmacy (with phone number or street name): [id number] [place] [address] [place] [address] Phone: [phone] Fax: [phone]    Agent: Please be advised that RX refills may take up to 3 business days. We ask that you follow-up with your pharmacy.

## 2019-05-15 NOTE — Telephone Encounter (Signed)
Copied from [place] [id number]. Topic: Quick Communication - Rx Refill/Question >> May 15, 2019  3:01 PM Erick Blinks wrote: Medication: BD INSULIN SYRINGE U/F 31G X 5/16" 0.5 ML MISC [[id number]] + ACCU-CHEK GUIDE test strip [[id number]] -90 day supply -Pt is out  Has the patient contacted their pharmacy? Yes  (Agent: If no, request that the patient contact the pharmacy for the refill.) (Agent: If yes, when and what did the pharmacy advise?)  Preferred Pharmacy (with phone number or street name): [id number] [place] [address] [place] [address] Phone: [phone] Fax: [phone]    Agent: Please be advised that RX refills may take up to 3 business days. We ask that you follow-up with your pharmacy.

## 2019-05-16 ENCOUNTER — Other Ambulatory Visit: Payer: Self-pay

## 2019-05-16 MED ORDER — "INSULIN SYRINGE-NEEDLE U-100 31G X 5/16"" 0.5 ML MISC": 12 refills | Status: DC

## 2019-05-16 MED ORDER — "INSULIN SYRINGE-NEEDLE U-100 31G X 5/16"" 0.5 ML MISC": 3 refills | Status: DC

## 2019-05-16 NOTE — Addendum Note (Signed)
Addended by: Chilton Greathouse on: 05/16/2019 12:03 PM   Modules accepted: Orders

## 2019-05-19 ENCOUNTER — Encounter: Payer: Self-pay | Admitting: Family Medicine

## 2019-05-22 ENCOUNTER — Other Ambulatory Visit: Payer: Self-pay

## 2019-05-22 DIAGNOSIS — E1140 Type 2 diabetes mellitus with diabetic neuropathy, unspecified: Secondary | ICD-10-CM

## 2019-05-22 DIAGNOSIS — N521 Erectile dysfunction due to diseases classified elsewhere: Secondary | ICD-10-CM

## 2019-05-22 MED ORDER — "INSULIN SYRINGE-NEEDLE U-100 31G X 5/16"" 0.5 ML MISC": 3 refills | Status: DC

## 2019-05-22 MED ORDER — PRECISION QID TEST VI STRP: ORAL_STRIP | 3 refills | Status: DC

## 2019-05-22 NOTE — Telephone Encounter (Signed)
Copied from [place] [id number]. Topic: Quick Communication - Rx Refill/Question >> May 15, 2019  3:01 PM Erick Blinks wrote: Medication: BD INSULIN SYRINGE U/F 31G X 5/16" 0.5 ML MISC [[id number]] + ACCU-CHEK GUIDE test strip [[id number]] -90 day supply  Has the patient contacted their pharmacy? Yes  (Agent: If no, request that the patient contact the pharmacy for the refill.) (Agent: If yes, when and what did the pharmacy advise?)  Preferred Pharmacy (with phone number or street name): [id number] [place] [address] [place] [address] Phone: [phone] Fax: [phone]    Agent: Please be advised that RX refills may take up to 3 business days. We ask that you follow-up with your pharmacy. >> May 22, 2019  3:06 PM Carolyn Stare wrote:   Pt following up on his req for refill    ACCU-CHEK GUIDE test strip

## 2019-05-23 ENCOUNTER — Telehealth: Payer: Self-pay | Admitting: Family Medicine

## 2019-05-23 DIAGNOSIS — N521 Erectile dysfunction due to diseases classified elsewhere: Secondary | ICD-10-CM

## 2019-05-23 DIAGNOSIS — E1140 Type 2 diabetes mellitus with diabetic neuropathy, unspecified: Secondary | ICD-10-CM

## 2019-05-23 MED ORDER — ACCU-CHEK GUIDE VI STRP: 1.0000 | ORAL_STRIP | Freq: Three times a day (TID) | 5 refills | Status: DC

## 2019-05-23 NOTE — Telephone Encounter (Signed)
Pt is requesting a prescription for accue check testing strips he said he cannot not use the Precision test strips. And would like this to be taken care of today if possible

## 2019-05-23 NOTE — Telephone Encounter (Signed)
[facility] that the doctor send in the patient's Accu check test strips as soon as possible.  Apparently the wrong strips were sent in and the patient cannot use the Precision test strips.  Patient has been without the strips for over a week.  Please advise and call patient or the pharmacy at [phone], if there are any questions.

## 2019-07-04 ENCOUNTER — Encounter: Payer: Self-pay | Admitting: Family Medicine

## 2019-07-04 ENCOUNTER — Ambulatory Visit (INDEPENDENT_AMBULATORY_CARE_PROVIDER_SITE_OTHER): Payer: 59 | Admitting: Family Medicine

## 2019-07-04 ENCOUNTER — Other Ambulatory Visit: Payer: Self-pay

## 2019-07-04 DIAGNOSIS — N521 Erectile dysfunction due to diseases classified elsewhere: Secondary | ICD-10-CM | POA: Diagnosis not present

## 2019-07-04 DIAGNOSIS — I10 Essential (primary) hypertension: Secondary | ICD-10-CM | POA: Diagnosis not present

## 2019-07-04 DIAGNOSIS — E7800 Pure hypercholesterolemia, unspecified: Secondary | ICD-10-CM | POA: Diagnosis not present

## 2019-07-04 DIAGNOSIS — E1140 Type 2 diabetes mellitus with diabetic neuropathy, unspecified: Secondary | ICD-10-CM

## 2019-07-04 LAB — POCT GLYCOSYLATED HEMOGLOBIN (HGB A1C): HbA1c, POC (controlled diabetic range): 6.8 % (ref 0.0–7.0)

## 2019-07-04 MED ORDER — AMLODIPINE BESY-BENAZEPRIL HCL 5-40 MG PO CAPS: 1.0000 | ORAL_CAPSULE | Freq: Every day | ORAL | 1 refills | Status: DC

## 2019-07-04 MED ORDER — ATORVASTATIN CALCIUM 80 MG PO TABS: 80.0000 mg | ORAL_TABLET | Freq: Every day | ORAL | 1 refills | Status: DC

## 2019-07-04 MED ORDER — INSULIN ASPART 100 UNIT/ML ~~LOC~~ SOLN: 2.0000 [IU] | Freq: Three times a day (TID) | SUBCUTANEOUS | 1 refills | Status: DC

## 2019-07-04 MED ORDER — JARDIANCE 25 MG PO TABS: 25.0000 mg | ORAL_TABLET | Freq: Every day | ORAL | 1 refills | Status: DC

## 2019-07-04 NOTE — Progress Notes (Signed)
Name: Joseph Johnson Kochan   MRN: [id number]    DOB: Jul 12, 1961   Date:07/04/2019       Progress Note  Subjective  Chief Complaint  Chief Complaint  Patient presents with   Diabetes   Hypertension   Hyperlipidemia    I connected with  Joseph Johnson Mohs on 07/04/19 at  8:40 AM EDT by telephone and verified that I am speaking with the correct person using two identifiers.  I discussed the limitations, risks, security and privacy concerns of performing an evaluation and management service by telephone and the availability of in person appointments. Staff also discussed with the patient that there may be a patient responsible charge related to this service. Patient Location: at home  [facility]   HPI  DMII:He [place] Dr. Tedd [place]um [place] because of [place] and A1C has improved he is back here for DM management.  A1C was 6.8% , last visit it was  7.4% and today 6.8% again he states he has not been as active lately because of COVID-19 ( working from home and not moving as much) he has been compliant with  Jardiance daily , Novolog pre mealby sliding scale before lunch and dinner and Xultophy 50 units daily. He has been checking glucose at home and fasting around 90's, he states glucose is going to 60's once or twice a week, usually when he has a light dinner - discussed risk of frequent hypoglycemic episodes, need for snack prior to bed time when glucose low. Twice in the past 3 months it went up to 250. He is on ACE and is due for urine micro .Eating healthier. He needs refill of Cialis.   HTN: he has been compliant with Lotrel lately. BP at home has been controlled, today 120/80 . No chest pain or palpitationNo dizziness  Hyperlipidemia: lipid panel was great,  taking medication daily, denies chest pain or myalgia.   ED: he states noticed difficulty with having an erection.   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 No pertinent surgical history.  Family History  Problem Relation Age of Onset   Hypertension Mother    Diabetes Father    Diabetes Brother    Hypercalcemia Brother    Hypertension Brother     Social History   Socioeconomic History   Marital status: Married    Spouse name: Myra    Number of children: 3   Years of education: Not on file   Highest education level: 12th grade  Occupational History   Not on file  Social Needs   Financial resource strain: Somewhat hard   Food insecurity    Worry: Never true    Inability: Never true   Transportation needs    Medical: No    Non-medical: No  Tobacco Use   Smoking status: Former Smoker    Types: Cigarettes    Quit date: 09/19/1990    Years since quitting: 28.8   Smokeless tobacco: Never Used   Tobacco comment: more than 20 years ago  Substance and Sexual Activity   Alcohol use: No    Alcohol/week: 0.0  standard drinks   Drug use: No   Sexual activity: Yes    Partners: Female  Lifestyle   Physical activity    Days per week: 0 days    Minutes per session: 0 min   Stress: Not at all  Relationships   Social connections    Talks on phone: More than three times a week    Gets together: More than three times a week    Attends religious service: More than 4 times per year    Active member of club or organization: Yes    Attends meetings of clubs or organizations: More than 4 times per year    Relationship status: Married   Intimate partner violence    Fear of current or ex partner: No    Emotionally abused: No     Physically abused: No    Forced sexual activity: No  Other Topics Concern   Not on file  Social History Narrative   Patient was in [place] about 2 weeks ago and hurt his lower back. Is under the care of Dr. Rock Nephew (Chiropractor)     Current Outpatient Medications:    ACCU-CHEK GUIDE test strip, 1 each by Other route 4 (four) times daily -  before meals and at bedtime. Use as instructed, Disp: 400 each, Rfl: 5   amLODipine-benazepril (LOTREL) 5-40 MG capsule, Take 1 capsule by mouth daily., Disp: 90 capsule, Rfl: 1   aspirin 81 MG tablet, , Disp: , Rfl:    atorvastatin (LIPITOR) 80 MG tablet, Take 1 tablet (80 mg total) by mouth daily., Disp: 90 tablet, Rfl: 1   empagliflozin (JARDIANCE) 25 MG TABS tablet, Take 25 mg by mouth daily., Disp: 90 tablet, Rfl: 1   insulin aspart (NOVOLOG) 100 UNIT/ML injection, Inject 2-15 Units into the skin 3 (three) times daily with meals., Disp: 40 mL, Rfl: 1   Insulin Syringe-Needle U-100 (BD INSULIN SYRINGE U/F) 31G X 5/16" 0.5 ML MISC, USE 4 TIMES A DAY, Disp: 400 each, Rfl: 3   levocetirizine (XYZAL) 5 MG tablet, TAKE 1 TABLET (5 MG TOTAL) BY MOUTH EVERY EVENING., Disp: 90 tablet, Rfl: 0   tadalafil (ADCIRCA/CIALIS) 20 MG tablet, Take 1 tablet (20 mg total) by mouth daily as needed for erectile dysfunction., Disp: 30 tablet, Rfl: 0   fluticasone (FLONASE) 50 MCG/ACT nasal spray, SPRAY 2 SPRAYS INTO EACH NOSTRIL EVERY DAY (Patient not taking: Reported on 07/04/2019), Disp: 48 g, Rfl: 0  No Known Allergies  I personally reviewed active problem list, medication list, allergies, family history, social history with the patient/caregiver today.   ROS  Ten systems reviewed and is negative except as mentioned in HPI   Objective  Virtual encounter  Vitals:   07/04/19 0939  BP: 120/80  Pulse: 71   There is no height or weight on file to calculate BMI.  Physical Exam  Awake, alert and oriented   PHQ2/9: Depression screen [facility] 2/9 07/04/2019  03/06/2019 10/19/2018 07/18/2018 05/17/2018  Decreased Interest 0 0 0 0 0  Down, Depressed, Hopeless 0 0 0 0 0  PHQ - 2 Score 0 0 0 0 0  Altered sleeping 0 0 0 0 0  Tired, decreased energy 0 0 0 0 0  Change in appetite 0 0 0 0 0  Feeling bad or failure about yourself  0 0 0 0 0  Trouble concentrating 0 0 0 0 0  Moving slowly or fidgety/restless 0 0 0 0 0  Suicidal thoughts 0 0 0 0  0  PHQ-9 Score 0 0 0 0 0  Difficult doing work/chores - Not difficult at all Not difficult at all Not difficult at all -   PHQ-2/9 Result is negative.    Fall Risk: Fall Risk  07/04/2019 03/06/2019 10/19/2018 07/18/2018 10/15/2017  Falls in the past year? 0 0 0 No No  Number falls in past yr: 0 0 - - -  Injury with Fall? 0 0 - - -    Assessment & Plan  1. Hypercholesteremia  - atorvastatin (LIPITOR) 80 MG tablet; Take 1 tablet (80 mg total) by mouth daily.  Dispense: 90 tablet; Refill: 1  2. Benign essential HTN  - amLODipine-benazepril (LOTREL) 5-40 MG capsule; Take 1 capsule by mouth daily.  Dispense: 90 capsule; Refill: 1  3. 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  Dispense: 40 mL; Refill: 1 - empagliflozin (JARDIANCE) 25 MG TABS tablet; Take 25 mg by mouth daily.  Dispense: 90 tablet; Refill: 1 - POCT glycosylated hemoglobin (Hb A1C) I discussed the assessment and treatment plan with the patient. The patient was provided an opportunity to ask questions and all were answered. The patient agreed with the plan and demonstrated an understanding of the instructions.   The patient was advised to call back or seek an in-person evaluation if the symptoms worsen or if the condition fails to improve as anticipated.  I provided 25 minutes of non-face-to-face time during this encounter.  Ruel FavorsKrichna F Jessamy Torosyan, MD

## 2019-07-05 ENCOUNTER — Other Ambulatory Visit: Payer: Self-pay

## 2019-07-05 DIAGNOSIS — Z20822 Contact with and (suspected) exposure to covid-19: Secondary | ICD-10-CM

## 2019-07-06 ENCOUNTER — Other Ambulatory Visit: Payer: Self-pay | Admitting: Family Medicine

## 2019-07-06 ENCOUNTER — Encounter: Payer: Self-pay | Admitting: Family Medicine

## 2019-07-06 DIAGNOSIS — E1140 Type 2 diabetes mellitus with diabetic neuropathy, unspecified: Secondary | ICD-10-CM

## 2019-07-06 DIAGNOSIS — N521 Erectile dysfunction due to diseases classified elsewhere: Secondary | ICD-10-CM

## 2019-07-07 ENCOUNTER — Other Ambulatory Visit: Payer: Self-pay | Admitting: Family Medicine

## 2019-07-07 LAB — NOVEL CORONAVIRUS, NAA: SARS-CoV-2, NAA: NOT DETECTED

## 2019-10-09 ENCOUNTER — Encounter: Payer: Self-pay | Admitting: Family Medicine

## 2019-10-09 ENCOUNTER — Ambulatory Visit (INDEPENDENT_AMBULATORY_CARE_PROVIDER_SITE_OTHER): Payer: 59 | Admitting: Family Medicine

## 2019-10-09 DIAGNOSIS — E7800 Pure hypercholesterolemia, unspecified: Secondary | ICD-10-CM

## 2019-10-09 DIAGNOSIS — E1140 Type 2 diabetes mellitus with diabetic neuropathy, unspecified: Secondary | ICD-10-CM

## 2019-10-09 DIAGNOSIS — N529 Male erectile dysfunction, unspecified: Secondary | ICD-10-CM

## 2019-10-09 DIAGNOSIS — I10 Essential (primary) hypertension: Secondary | ICD-10-CM | POA: Diagnosis not present

## 2019-10-09 DIAGNOSIS — J3089 Other allergic rhinitis: Secondary | ICD-10-CM | POA: Diagnosis not present

## 2019-10-09 DIAGNOSIS — N521 Erectile dysfunction due to diseases classified elsewhere: Secondary | ICD-10-CM

## 2019-10-09 MED ORDER — ATORVASTATIN CALCIUM 80 MG PO TABS: 80.0000 mg | ORAL_TABLET | Freq: Every day | ORAL | 1 refills | Status: DC

## 2019-10-09 MED ORDER — FLUTICASONE PROPIONATE 50 MCG/ACT NA SUSP: 2.0000 | Freq: Every day | NASAL | 0 refills | Status: DC

## 2019-10-09 MED ORDER — TADALAFIL 20 MG PO TABS: 20.0000 mg | ORAL_TABLET | Freq: Every day | ORAL | 0 refills | Status: DC | PRN

## 2019-10-09 MED ORDER — XULTOPHY 100-3.6 UNIT-MG/ML ~~LOC~~ SOPN: 50.0000 [IU] | PEN_INJECTOR | Freq: Every day | SUBCUTANEOUS | 1 refills | Status: DC

## 2019-10-09 MED ORDER — ACCU-CHEK GUIDE VI STRP: 1.0000 | ORAL_STRIP | Freq: Three times a day (TID) | 5 refills | Status: DC

## 2019-10-09 MED ORDER — INSULIN ASPART 100 UNIT/ML ~~LOC~~ SOLN: 2.0000 [IU] | Freq: Three times a day (TID) | SUBCUTANEOUS | 1 refills | Status: DC

## 2019-10-09 MED ORDER — AMLODIPINE BESY-BENAZEPRIL HCL 5-40 MG PO CAPS: 1.0000 | ORAL_CAPSULE | Freq: Every day | ORAL | 1 refills | Status: DC

## 2019-10-09 MED ORDER — INSULIN PEN NEEDLE 32G X 6 MM MISC: 1.0000 | Freq: Every day | 1 refills | Status: DC

## 2019-10-09 MED ORDER — JARDIANCE 25 MG PO TABS: 25.0000 mg | ORAL_TABLET | Freq: Every day | ORAL | 1 refills | Status: DC
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
## 2020-03-03 ENCOUNTER — Ambulatory Visit: Payer: Self-pay | Attending: Internal Medicine

## 2020-03-03 DIAGNOSIS — Z23 Encounter for immunization: Secondary | ICD-10-CM

## 2020-03-03 NOTE — Progress Notes (Signed)
   Covid-19 Vaccination Clinic  Name:  Joseph Johnson    MRN: [id number] DOB: [birth year]  03/03/2020  Joseph Johnson was observed post Covid-19 immunization for 15 minutes without incident. He was provided with Vaccine Information Sheet and instruction to access the V-Safe system.   Joseph Johnson was instructed to call 911 with any severe reactions post vaccine: Marland Kitchen Difficulty breathing  . Swelling of face and throat  . A fast heartbeat  . A bad rash all over body  . Dizziness and weakness   Immunizations Administered    Name Date Dose VIS Date Route   Pfizer COVID-19 Vaccine 03/03/2020  9:25 AM 0.3 mL 11/17/2019 Intramuscular   Manufacturer: [employer], [employer]   Lot: [id number]   NDC: [address]-4

## 2020-03-20 ENCOUNTER — Other Ambulatory Visit: Payer: Self-pay

## 2020-03-20 ENCOUNTER — Ambulatory Visit (INDEPENDENT_AMBULATORY_CARE_PROVIDER_SITE_OTHER): Payer: 59 | Admitting: Family Medicine

## 2020-03-20 ENCOUNTER — Encounter: Payer: Self-pay | Admitting: Family Medicine

## 2020-03-20 VITALS — BP 130/84 | HR 97 | Temp 97.8°F | Resp 18 | Ht 71.0 in | Wt 203.9 lb

## 2020-03-20 DIAGNOSIS — N529 Male erectile dysfunction, unspecified: Secondary | ICD-10-CM | POA: Diagnosis not present

## 2020-03-20 DIAGNOSIS — E1140 Type 2 diabetes mellitus with diabetic neuropathy, unspecified: Secondary | ICD-10-CM

## 2020-03-20 DIAGNOSIS — A6002 Herpesviral infection of other male genital organs: Secondary | ICD-10-CM

## 2020-03-20 DIAGNOSIS — E119 Type 2 diabetes mellitus without complications: Secondary | ICD-10-CM

## 2020-03-20 DIAGNOSIS — Z794 Long term (current) use of insulin: Secondary | ICD-10-CM

## 2020-03-20 DIAGNOSIS — N481 Balanitis: Secondary | ICD-10-CM

## 2020-03-20 DIAGNOSIS — I10 Essential (primary) hypertension: Secondary | ICD-10-CM

## 2020-03-20 DIAGNOSIS — E7800 Pure hypercholesterolemia, unspecified: Secondary | ICD-10-CM | POA: Diagnosis not present

## 2020-03-20 DIAGNOSIS — N521 Erectile dysfunction due to diseases classified elsewhere: Secondary | ICD-10-CM

## 2020-03-20 MED ORDER — TADALAFIL 20 MG PO TABS: 20.0000 mg | ORAL_TABLET | Freq: Every day | ORAL | 0 refills | Status: DC | PRN

## 2020-03-20 MED ORDER — JARDIANCE 25 MG PO TABS: 25.0000 mg | ORAL_TABLET | Freq: Every day | ORAL | 1 refills | Status: DC

## 2020-03-20 MED ORDER — AMLODIPINE BESY-BENAZEPRIL HCL 5-40 MG PO CAPS: 1.0000 | ORAL_CAPSULE | Freq: Every day | ORAL | 1 refills | Status: DC

## 2020-03-20 MED ORDER — INSULIN PEN NEEDLE 32G X 6 MM MISC: 1.0000 | Freq: Every day | 1 refills | Status: DC

## 2020-03-20 MED ORDER — "INSULIN SYRINGE-NEEDLE U-100 31G X 5/16"" 0.5 ML MISC": 3 refills | Status: DC

## 2020-03-20 MED ORDER — VALACYCLOVIR HCL 500 MG PO TABS: 500.0000 mg | ORAL_TABLET | Freq: Three times a day (TID) | ORAL | 2 refills | Status: AC

## 2020-03-20 MED ORDER — FLUCONAZOLE 150 MG PO TABS: 150.0000 mg | ORAL_TABLET | ORAL | 0 refills | Status: DC

## 2020-03-20 MED ORDER — INSULIN ASPART 100 UNIT/ML ~~LOC~~ SOLN: 2.0000 [IU] | Freq: Three times a day (TID) | SUBCUTANEOUS | 1 refills | Status: DC

## 2020-03-20 MED ORDER — ATORVASTATIN CALCIUM 80 MG PO TABS: 80.0000 mg | ORAL_TABLET | Freq: Every day | ORAL | 1 refills | Status: DC

## 2020-03-20 MED ORDER — XULTOPHY 100-3.6 UNIT-MG/ML ~~LOC~~ SOPN: 50.0000 [IU] | PEN_INJECTOR | Freq: Every day | SUBCUTANEOUS | 1 refills | Status: DC

## 2020-03-20 NOTE — Progress Notes (Signed)
Name: Joseph Johnson   MRN: [id number]    DOB: [birth year]   Date:03/20/2020       Progress Note  Subjective  Chief Complaint  Chief Complaint  Patient presents with  . Medication Refill  . Diabetes    Checks BS twice daily Average-123  . Hypertension    Denies any symptoms  . Hyperlipidemia  . Erectile Dysfunction    HPI  DMII:He saw Dr. Tedd Sias in [place] 2019but because of cost and A1C has improved he is back here for DM management. A1C was 6.8% ,7.4%,6.8% he had labs done today and we will wait for results. He is taking Jardiance daily , Novolog pre mealby sliding scalebefore lunch and dinner and Xultophy 50 units daily. He has been checking glucose at home  123 average , he has occasional hypoglycemia , last episode was this am, it happens about three episodes in the past 4 monthsHe is on ACE and is due for urine micro.  HTN: he has been compliant with Lotrel lately. BPat home has been controlled around 120's/80's . No chest pain , dizziness or palpitation   Hyperlipidemia:lipid panelwas great he went to the lab todaytaking medication daily, denies chest pain or myalgia.  ED: he states Cialis prn and it works well for him, he needs a refill today   Herpes Genitalis: he started to have outbreaks in his 20's, he states last episode was over two years ago, he states pain started last week , initially irritation, this week he saw lesions on foreskin, some swelling and inflammation. He also has a history of balanitis, and because of itching and inflammation unable to pull foreskin back and we will treat with diflucan   Patient Active Problem List   Diagnosis Date Noted  . Type 2 diabetes mellitus with hyperglycemia, with long-term current use of insulin (HCC) 03/11/2018  . Allergic rhinitis, seasonal 03/03/2016  . ED (erectile dysfunction) 06/21/2015  . 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 Hypercholesteremia  06/20/2015  . Excess weight 06/20/2015  . Benign essential HTN 02/02/2008    History reviewed. No pertinent surgical history.  Family History  Problem Relation Age of Onset  . Hypertension Mother   . Diabetes Father   . Diabetes Brother   . Hypercalcemia Brother   . Hypertension Brother     Social History   Tobacco Use  . Smoking status: Former Smoker    Types: Cigarettes    Quit date: 09/19/1990    Years since quitting: 29.5  . Smokeless tobacco: Never Used  . Tobacco comment: more than 20 years ago  Substance Use Topics  . Alcohol use: No    Alcohol/week: 0.0 standard drinks     Current Outpatient Medications:  .  ACCU-CHEK GUIDE test strip, 1 each by Other route 4 (four) times daily -  before meals and at bedtime. Use as instructed, Disp: 400 each, Rfl: 5 .  amLODipine-benazepril (LOTREL) 5-40 MG capsule, Take 1 capsule by mouth daily., Disp: 90 capsule, Rfl: 1 .  aspirin 81 MG tablet, , Disp: , Rfl:  .  atorvastatin (LIPITOR) 80 MG tablet, Take 1 tablet (80 mg total) by mouth daily., Disp: 90 tablet, Rfl: 1 .  empagliflozin (JARDIANCE) 25 MG TABS tablet, Take 25 mg by mouth daily., Disp: 90 tablet, Rfl: 1 .  fluticasone (FLONASE) 50 MCG/ACT nasal spray, Place 2 sprays into both nostrils daily., Disp: 48 g, Rfl: 0 .  insulin aspart (NOVOLOG) 100 UNIT/ML  injection, Inject 2-15 Units into the skin 3 (three) times daily with meals., Disp: 40 mL, Rfl: 1 .  Insulin Degludec-Liraglutide (XULTOPHY) 100-3.6 UNIT-MG/ML SOPN, Inject 50 Units into the skin daily., Disp: 45 mL, Rfl: 1 .  Insulin Pen Needle 32G X 6 MM MISC, 1 each by Does not apply route daily., Disp: 100 each, Rfl: 1 .  Insulin Syringe-Needle U-100 (BD INSULIN SYRINGE U/F) 31G X 5/16" 0.5 ML MISC, USE 4 TIMES A DAY, Disp: 400 each, Rfl: 3 .  levocetirizine (XYZAL) 5 MG tablet, TAKE 1 TABLET (5 MG TOTAL) BY MOUTH EVERY EVENING., Disp: 90 tablet, Rfl: 0 .  tadalafil (CIALIS) 20 MG tablet, Take 1 tablet (20 mg total)  by mouth daily as needed for erectile dysfunction., Disp: 30 tablet, Rfl: 0  No Known Allergies  I personally reviewed active problem list, medication list, allergies, family history, social history, health maintenance with the patient/caregiver today.   ROS  Constitutional: Negative for fever or weight change.  Respiratory: Negative for cough and shortness of breath.   Cardiovascular: Negative for chest pain or palpitations.  Gastrointestinal: Negative for abdominal pain, no bowel changes.  Musculoskeletal: Negative for gait problem or joint swelling.  Skin: Negative for rash.  Neurological: Negative for dizziness or headache.  No other specific complaints in a complete review of systems (except as listed in HPI above).  Objective  Vitals:   03/20/20 0944  BP: 130/84  Pulse: 97  Resp: 18  Temp: 97.8 F (36.6 C)  TempSrc: Temporal  SpO2: 99%  Weight: 203 lb 14.4 oz (92.5 kg)  Height: 5\' 11"  (1.803 m)    Body mass index is 28.44 kg/m.  Physical Exam  Constitutional: Patient appears well-developed and well-nourished. Overweight. No distress.  HEENT: head atraumatic, normocephalic, pupils equal and reactive to light Cardiovascular: Normal rate, regular rhythm and normal heart sounds.  No murmur heard. No BLE edema. Pulmonary/Chest: Effort normal and breath sounds normal. No respiratory distress. Abdominal: Soft.  There is no tenderness. Genitourinary: vesicles on foreskin , swelling some erythema  Psychiatric: Patient has a normal mood and affect. behavior is normal. Judgment and thought content normal.  Diabetic Foot Exam: Diabetic Foot Exam - Simple   Simple Foot Form Diabetic Foot exam was performed with the following findings: Yes 03/20/2020 10:23 AM  Visual Inspection No deformities, no ulcerations, no other skin breakdown bilaterally: Yes Sensation Testing Intact to touch and monofilament testing bilaterally: Yes Pulse Check Posterior Tibialis and Dorsalis  pulse intact bilaterally: Yes Comments      PHQ2/9: Depression screen [facility] 2/9 03/20/2020 10/09/2019 07/04/2019 03/06/2019 10/19/2018  Decreased Interest 2 0 0 0 0  Down, Depressed, Hopeless 0 0 0 0 0  PHQ - 2 Score 2 0 0 0 0  Altered sleeping 0 0 0 0 0  Tired, decreased energy 0 0 0 0 0  Change in appetite 0 0 0 0 0  Feeling bad or failure about yourself  0 0 0 0 0  Trouble concentrating 0 0 0 0 0  Moving slowly or fidgety/restless 0 0 0 0 0  Suicidal thoughts 0 0 0 0 0  PHQ-9 Score 2 0 0 0 0  Difficult doing work/chores Not difficult at all - - Not difficult at all Not difficult at all  Some recent data might be hidden    phq 9 is negative   Fall Risk: Fall Risk  03/20/2020 10/09/2019 07/04/2019 03/06/2019 10/19/2018  Falls in the past year? 0 0 0 0  0  Number falls in past yr: 0 0 0 0 -  Injury with Fall? 0 0 0 0 -     Functional Status Survey: Is the patient deaf or have difficulty hearing?: No Does the patient have difficulty seeing, even when wearing glasses/contacts?: Yes Does the patient have difficulty concentrating, remembering, or making decisions?: No Does the patient have difficulty walking or climbing stairs?: No Does the patient have difficulty dressing or bathing?: No Does the patient have difficulty doing errands alone such as visiting a doctor's office or shopping?: No    Assessment & Plan  1. 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  Dispense: 30 tablet; Refill: 0 - Insulin Pen Needle 32G X 6 MM MISC; 1 each by Does not apply route daily.  Dispense: 100 each; Refill: 1 - Insulin Degludec-Liraglutide (XULTOPHY) 100-3.6 UNIT-MG/ML SOPN; Inject 50 Units into the skin daily.  Dispense: 45 mL; Refill: 1 - insulin aspart (NOVOLOG) 100 UNIT/ML injection; Inject 2-15 Units into the skin 3 (three) times daily with  meals.  Dispense: 40 mL; Refill: 1 - empagliflozin (JARDIANCE) 25 MG TABS tablet; Take 25 mg by mouth daily.  Dispense: 90 tablet; Refill: 1 - Insulin Syringe-Needle U-100 (BD INSULIN SYRINGE U/F) 31G X 5/16" 0.5 ML MISC; USE 4 TIMES A DAY  Dispense: 400 each; Refill: 3  2. ED (erectile dysfunction) of organic origin  - tadalafil (CIALIS) 20 MG tablet; Take 1 tablet (20 mg total) by mouth daily as needed for erectile dysfunction.  Dispense: 30 tablet; Refill: 0  3. Hypercholesteremia  - atorvastatin (LIPITOR) 80 MG tablet; Take 1 tablet (80 mg total) by mouth daily.  Dispense: 90 tablet; Refill: 1  4. Benign essential HTN  - amLODipine-benazepril (LOTREL) 5-40 MG capsule; Take 1 capsule by mouth daily.  Dispense: 90 capsule; Refill: 1  5. Diabetes mellitus type 2, insulin dependent (HCC)   6. Balanitis  - fluconazole (DIFLUCAN) 150 MG tablet; Take 1 tablet (150 mg total) by mouth every other day.  Dispense: 3 tablet; Refill: 0  7. Herpes genitalis in men  - valACYclovir (VALTREX) 500 MG tablet; Take 1 tablet (500 mg total) by mouth 3 (three) times daily.  Dispense: 30 tablet; Refill: 2

## 2020-03-21 LAB — LIPID PANEL
Cholesterol: 130 mg/dL (ref <200)
HDL: 42 mg/dL (ref >=40)
LDL Cholesterol (Calc): 68 mg/dL (calc)
Non-HDL Cholesterol (Calc): 88 mg/dL (calc) (ref <130)
Total CHOL/HDL Ratio: 3.1 (calc) (ref <5.0)
Triglycerides: 115 mg/dL (ref <150)

## 2020-03-21 LAB — CBC WITH DIFFERENTIAL/PLATELET
Absolute Monocytes: 369 cells/uL (ref 200–950)
Basophils Absolute: 21 cells/uL (ref 0–200)
Basophils Relative: 0.4 %
Eosinophils Absolute: 42 cells/uL (ref 15–500)
Eosinophils Relative: 0.8 %
HCT: 46 % (ref 38.5–50.0)
Hemoglobin: 15 g/dL (ref 13.2–17.1)
Lymphs Abs: 1165 cells/uL (ref 850–3900)
MCH: 29.3 pg (ref 27.0–33.0)
MCHC: 32.6 g/dL (ref 32.0–36.0)
MCV: 89.8 fL (ref 80.0–100.0)
MPV: 13.2 fL — ABNORMAL HIGH (ref 7.5–12.5)
Monocytes Relative: 7.1 %
Neutro Abs: 3604 cells/uL (ref 1500–7800)
Neutrophils Relative %: 69.3 %
Platelets: 168 10*3/uL (ref 140–400)
RBC: 5.12 10*6/uL (ref 4.20–5.80)
RDW: 13 % (ref 11.0–15.0)
Total Lymphocyte: 22.4 %
WBC: 5.2 10*3/uL (ref 3.8–10.8)

## 2020-03-21 LAB — COMPLETE METABOLIC PANEL WITH GFR
AG Ratio: 1.8 (calc) (ref 1.0–2.5)
ALT: 18 U/L (ref 9–46)
AST: 20 U/L (ref 10–35)
Albumin: 4.3 g/dL (ref 3.6–5.1)
Alkaline phosphatase (APISO): 63 U/L (ref 35–144)
BUN: 19 mg/dL (ref 7–25)
CO2: 29 mmol/L (ref 20–32)
Calcium: 9.1 mg/dL (ref 8.6–10.3)
Chloride: 103 mmol/L (ref 98–110)
Creat: 1.2 mg/dL (ref 0.70–1.33)
GFR, Est African American: 78 mL/min/{1.73_m2} (ref >=60)
GFR, Est Non African American: 67 mL/min/{1.73_m2} (ref >=60)
Globulin: 2.4 g/dL (calc) (ref 1.9–3.7)
Glucose, Bld: 175 mg/dL — ABNORMAL HIGH (ref 65–99)
Potassium: 4.5 mmol/L (ref 3.5–5.3)
Sodium: 139 mmol/L (ref 135–146)
Total Bilirubin: 1.1 mg/dL (ref 0.2–1.2)
Total Protein: 6.7 g/dL (ref 6.1–8.1)

## 2020-03-21 LAB — HEMOGLOBIN A1C
Hgb A1c MFr Bld: 8 % of total Hgb — ABNORMAL HIGH (ref <5.7)
Mean Plasma Glucose: 183 (calc)
eAG (mmol/L): 10.1 (calc)

## 2020-03-21 LAB — MICROALBUMIN / CREATININE URINE RATIO
Creatinine, Urine: 53 mg/dL (ref 20–320)
Microalb Creat Ratio: 9 mcg/mg creat (ref <30)
Microalb, Ur: 0.5 mg/dL

## 2020-03-27 ENCOUNTER — Ambulatory Visit: Payer: Self-pay | Attending: Internal Medicine

## 2020-03-27 DIAGNOSIS — Z23 Encounter for immunization: Secondary | ICD-10-CM

## 2020-03-27 NOTE — Progress Notes (Signed)
   Covid-19 Vaccination Clinic  Name:  MICHARL HELMES    MRN: [id number] DOB: 04-07-1961  03/27/2020  Mr. Youtz was observed post Covid-19 immunization for 15 minutes without incident. He was provided with Vaccine Information Sheet and instruction to access the V-Safe system.   Mr. Crotteau was instructed to call 911 with any severe reactions post vaccine: Marland Kitchen Difficulty breathing  . Swelling of face and throat  . A fast heartbeat  . A bad rash all over body  . Dizziness and weakness   Immunizations Administered    Name Date Dose VIS Date Route   Pfizer COVID-19 Vaccine 03/27/2020  9:18 AM 0.3 mL 01/31/2019 Intramuscular   Manufacturer: [employer], [employer]   Lot: [id number]   NDC: [address]-2

## 2020-04-11 ENCOUNTER — Other Ambulatory Visit: Payer: Self-pay | Admitting: Family Medicine

## 2020-04-11 DIAGNOSIS — N481 Balanitis: Secondary | ICD-10-CM

## 2020-04-12 MED ORDER — FLUCONAZOLE 150 MG PO TABS: 150.0000 mg | ORAL_TABLET | ORAL | 0 refills | Status: DC

## 2020-05-07 ENCOUNTER — Encounter: Payer: Self-pay | Admitting: Family Medicine

## 2020-05-08 ENCOUNTER — Other Ambulatory Visit: Payer: Self-pay | Admitting: Family Medicine

## 2020-05-08 DIAGNOSIS — N521 Erectile dysfunction due to diseases classified elsewhere: Secondary | ICD-10-CM

## 2020-05-08 DIAGNOSIS — E1140 Type 2 diabetes mellitus with diabetic neuropathy, unspecified: Secondary | ICD-10-CM

## 2020-05-08 MED ORDER — NOVOLOG FLEXPEN 100 UNIT/ML ~~LOC~~ SOPN: 3.0000 [IU] | PEN_INJECTOR | Freq: Three times a day (TID) | SUBCUTANEOUS | 0 refills | Status: DC

## 2020-07-20 LAB — HM DIABETES EYE EXAM

## 2020-07-25 NOTE — Progress Notes (Signed)
No show

## 2020-07-26 ENCOUNTER — Ambulatory Visit: Payer: 59 | Admitting: Family Medicine

## 2020-08-29 NOTE — Progress Notes (Signed)
Name: Joseph Johnson   MRN: [id number]    DOB: 1961/09/27   Date:08/30/2020       Progress Note  Subjective  Chief Complaint  Chief Complaint  Patient presents with  . Follow-up  . Diabetes  . Hypertension    HPI  DMII:He saw Dr. Tedd Sias in [place] 2019but because of cost of seeing sub-specialist  and A1C has improved he is back here for DM management. A1C was 6.8% ,7.4%, 6.8% and it went up to 8 % Spring 2021 today is 8.8 % . He is taking Jardiance daily, Novolog pre mealby sliding scalebefore lunch and dinner and Xultophy 50 units daily.He states his glucose fasting between 90-130, before dinner between 170's to low 200's. He states he has been working in [place] from M-F and is eating instant food or fast food. Discussed importance of healthier eating. He will plan his meals better, and he will start taking Jardiance daily to get glucose below 8 % by next visit , he understands that we will have to send him back to Endo if unable to bring glucose levels down He has associated HTN, dyslipidemia, ED . He is on ACE and statin therapy and Cialis   HTN: he has not been compliant with Lotrel lately, he is only taking two or three times a week.  Not checking bp at home lately. No chest pain, dizziness or palpitationHe is eating more processed food and bp is high today, usually at goal.   Hyperlipidemia:lipid panelwas great but he has not been compliant with medication lately, denies chest pain or myalgia.   ED: hestates Cialis prn and it works well for him, he would like a refill   Herpes Genitalis: he started to have outbreaks in his 20's, he states last episode was over two years ago, he had an episodes April 2021 but nothing since, he does not take suppressive medication since married and episodes are infrequent    Patient Active Problem List   Diagnosis Date Noted  . Type 2 diabetes mellitus with hyperglycemia, with long-term current use of insulin (HCC) 03/11/2018  .  Allergic rhinitis, seasonal 03/03/2016  . ED (erectile dysfunction) 06/21/2015  . 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 Hypercholesteremia 06/20/2015  . Excess weight 06/20/2015  . Benign essential HTN 02/02/2008    History reviewed. No pertinent surgical history.  Family History  Problem Relation Age of Onset  . Hypertension Mother   . Diabetes Father   . Diabetes Brother   . Hypercalcemia Brother   . Hypertension Brother     Social History   Tobacco Use  . Smoking status: Former Smoker    Types: Cigarettes    Quit date: 09/19/1990    Years since quitting: 29.9  . Smokeless tobacco: Never Used  . Tobacco comment: more than 20 years ago  Substance Use Topics  . Alcohol use: No    Alcohol/week: 0.0 standard drinks     Current Outpatient Medications:  .  ACCU-CHEK GUIDE test strip, 1 each by Other route 4 (four) times daily -  before meals and at bedtime. Use as instructed, Disp: 400 each, Rfl: 5 .  amLODipine-benazepril (LOTREL) 5-40 MG capsule, Take 1 capsule by mouth daily., Disp: 90 capsule, Rfl: 1 .  aspirin 81 MG tablet, , Disp: , Rfl:  .  atorvastatin (LIPITOR) 80 MG tablet, Take 1 tablet (80 mg total) by mouth daily., Disp: 90 tablet, Rfl: 1 .  empagliflozin (JARDIANCE) 25  MG TABS tablet, Take 25 mg by mouth daily., Disp: 90 tablet, Rfl: 1 .  fluticasone (FLONASE) 50 MCG/ACT nasal spray, Place 2 sprays into both nostrils daily., Disp: 48 g, Rfl: 0 .  insulin aspart (NOVOLOG FLEXPEN) 100 UNIT/ML FlexPen, Inject 3-15 Units into the skin 3 (three) times daily with meals., Disp: 45 mL, Rfl: 0 .  Insulin Degludec-Liraglutide (XULTOPHY) 100-3.6 UNIT-MG/ML SOPN, Inject 50 Units into the skin daily., Disp: 45 mL, Rfl: 1 .  Insulin Pen Needle 32G X 6 MM MISC, 1 each by Does not apply route daily., Disp: 100 each, Rfl: 1 .  Insulin Syringe-Needle U-100 (BD INSULIN SYRINGE U/F) 31G X 5/16" 0.5 ML MISC, USE 4 TIMES A DAY,  Disp: 400 each, Rfl: 3 .  levocetirizine (XYZAL) 5 MG tablet, TAKE 1 TABLET (5 MG TOTAL) BY MOUTH EVERY EVENING., Disp: 90 tablet, Rfl: 0 .  tadalafil (CIALIS) 20 MG tablet, Take 1 tablet (20 mg total) by mouth daily as needed for erectile dysfunction., Disp: 30 tablet, Rfl: 0 .  valACYclovir (VALTREX) 500 MG tablet, Take 1 tablet (500 mg total) by mouth 3 (three) times daily., Disp: 30 tablet, Rfl: 2  No Known Allergies  I personally reviewed active problem list, medication list, allergies, family history, social history, health maintenance with the patient/caregiver today.   ROS  Constitutional: Negative for fever or weight change.  Respiratory: Negative for cough and shortness of breath.   Cardiovascular: Negative for chest pain or palpitations.  Gastrointestinal: Negative for abdominal pain, no bowel changes.  Musculoskeletal: Negative for gait problem or joint swelling.  Skin: Negative for rash.  Neurological: Negative for dizziness or headache.  No other specific complaints in a complete review of systems (except as listed in HPI above).  Objective  Vitals:   08/30/20 1103  BP: (!) 152/94  Pulse: 96  Resp: 16  Temp: 98.2 F (36.8 C)  TempSrc: Oral  SpO2: 100%  Weight: 201 lb 4.8 oz (91.3 kg)  Height: 5\' 11"  (1.803 m)    Body mass index is 28.08 kg/m.  Physical Exam  Constitutional: Patient appears well-developed and well-nourished. Obese  No distress.  HEENT: head atraumatic, normocephalic, pupils equal and reactive to light,  neck supple Cardiovascular: Normal rate, regular rhythm and normal heart sounds.  No murmur heard. No BLE edema. Pulmonary/Chest: Effort normal and breath sounds normal. No respiratory distress. Abdominal: Soft.  There is no tenderness. Psychiatric: Patient has a normal mood and affect. behavior is normal. Judgment and thought content normal.  Recent Results (from the past 2160 hour(s))  HM DIABETES EYE EXAM     Status: Abnormal    Collection Time: 07/20/20 12:00 AM  Result Value Ref Range   HM Diabetic Eye Exam Retinopathy (A) No Retinopathy    Comment: See report  POCT HgB A1C     Status: Abnormal   Collection Time: 08/30/20 11:06 AM  Result Value Ref Range   Hemoglobin A1C 8.8 (A) 4.0 - 5.6 %   HbA1c POC (<> result, manual entry)     HbA1c, POC (prediabetic range)     HbA1c, POC (controlled diabetic range)       PHQ2/9: Depression screen [facility] 2/9 08/30/2020 03/20/2020 10/09/2019 07/04/2019 03/06/2019  Decreased Interest 0 2 0 0 0  Down, Depressed, Hopeless 0 0 0 0 0  PHQ - 2 Score 0 2 0 0 0  Altered sleeping - 0 0 0 0  Tired, decreased energy - 0 0 0 0  Change in appetite -  0 0 0 0  Feeling bad or failure about yourself  - 0 0 0 0  Trouble concentrating - 0 0 0 0  Moving slowly or fidgety/restless - 0 0 0 0  Suicidal thoughts - 0 0 0 0  PHQ-9 Score - 2 0 0 0  Difficult doing work/chores - Not difficult at all - - Not difficult at all  Some recent data might be hidden    phq 9 is negative   Fall Risk: Fall Risk  08/30/2020 03/20/2020 10/09/2019 07/04/2019 03/06/2019  Falls in the past year? 0 0 0 0 0  Number falls in past yr: 0 0 0 0 0  Injury with Fall? 0 0 0 0 0     Functional Status Survey: Is the patient deaf or have difficulty hearing?: No Does the patient have difficulty seeing, even when wearing glasses/contacts?: No Does the patient have difficulty concentrating, remembering, or making decisions?: No Does the patient have difficulty walking or climbing stairs?: No Does the patient have difficulty dressing or bathing?: No Does the patient have difficulty doing errands alone such as visiting a doctor's office or shopping?: No    Assessment & Plan  1. Diabetes mellitus type 2, insulin dependent (HCC)  - POCT HgB A1C  2. Encounter for screening for HIV  Next visit   3. Need for immunization against influenza  - Flu Vaccine QUAD 36+ mos IM  4. Type II diabetes mellitus with neuropathy  causing erectile dysfunction (HCC)  He needs to resume compliance with medication   5. Benign essential HTN  bp is high, but not taking medications as prescribed  6. ED (erectile dysfunction) of organic origin   7. Hypertension complicating diabetes (HCC)   8. Dyslipidemia associated with type 2 diabetes mellitus (HCC)

## 2020-08-30 ENCOUNTER — Other Ambulatory Visit: Payer: Self-pay

## 2020-08-30 ENCOUNTER — Ambulatory Visit: Payer: 59 | Admitting: Family Medicine

## 2020-08-30 ENCOUNTER — Encounter: Payer: Self-pay | Admitting: Family Medicine

## 2020-08-30 VITALS — BP 140/90 | HR 96 | Temp 98.2°F | Resp 16 | Ht 71.0 in | Wt 201.3 lb

## 2020-08-30 DIAGNOSIS — E119 Type 2 diabetes mellitus without complications: Secondary | ICD-10-CM

## 2020-08-30 DIAGNOSIS — I152 Hypertension secondary to endocrine disorders: Secondary | ICD-10-CM

## 2020-08-30 DIAGNOSIS — Z23 Encounter for immunization: Secondary | ICD-10-CM

## 2020-08-30 DIAGNOSIS — E1140 Type 2 diabetes mellitus with diabetic neuropathy, unspecified: Secondary | ICD-10-CM | POA: Diagnosis not present

## 2020-08-30 DIAGNOSIS — N521 Erectile dysfunction due to diseases classified elsewhere: Secondary | ICD-10-CM

## 2020-08-30 DIAGNOSIS — Z114 Encounter for screening for human immunodeficiency virus [HIV]: Secondary | ICD-10-CM | POA: Diagnosis not present

## 2020-08-30 DIAGNOSIS — E1159 Type 2 diabetes mellitus with other circulatory complications: Secondary | ICD-10-CM

## 2020-08-30 DIAGNOSIS — E785 Hyperlipidemia, unspecified: Secondary | ICD-10-CM

## 2020-08-30 DIAGNOSIS — Z794 Long term (current) use of insulin: Secondary | ICD-10-CM | POA: Diagnosis not present

## 2020-08-30 DIAGNOSIS — I10 Essential (primary) hypertension: Secondary | ICD-10-CM

## 2020-08-30 DIAGNOSIS — E1169 Type 2 diabetes mellitus with other specified complication: Secondary | ICD-10-CM

## 2020-08-30 DIAGNOSIS — N529 Male erectile dysfunction, unspecified: Secondary | ICD-10-CM

## 2020-08-30 LAB — POCT GLYCOSYLATED HEMOGLOBIN (HGB A1C): Hemoglobin A1C: 8.8 % — AB (ref 4.0–5.6)

## 2020-10-07 ENCOUNTER — Other Ambulatory Visit: Payer: Self-pay | Admitting: Family Medicine

## 2020-10-07 DIAGNOSIS — E1140 Type 2 diabetes mellitus with diabetic neuropathy, unspecified: Secondary | ICD-10-CM

## 2020-10-07 DIAGNOSIS — N521 Erectile dysfunction due to diseases classified elsewhere: Secondary | ICD-10-CM

## 2020-10-07 NOTE — Telephone Encounter (Signed)
Future appt in 2 months 

## 2020-10-09 ENCOUNTER — Other Ambulatory Visit: Payer: Self-pay | Admitting: Family Medicine

## 2020-10-09 DIAGNOSIS — N521 Erectile dysfunction due to diseases classified elsewhere: Secondary | ICD-10-CM

## 2020-10-09 DIAGNOSIS — E1140 Type 2 diabetes mellitus with diabetic neuropathy, unspecified: Secondary | ICD-10-CM

## 2020-10-09 NOTE — Telephone Encounter (Signed)
Requested Prescriptions  Pending Prescriptions Disp Refills  . ACCU-CHEK GUIDE test strip [Pharmacy Med Name: ACCU-CHEK GUIDE TEST STRIP] 400 strip 5    Sig: 1 EACH BY OTHER ROUTE 4 (FOUR) TIMES DAILY - BEFORE MEALS AND AT BEDTIME. USE AS INSTRUCTED     Endocrinology: Diabetes - Testing Supplies Passed - 10/09/2020  1:21 AM      Passed - Valid encounter within last 12 months    Recent Outpatient Visits          1 month ago Diabetes mellitus type 2, insulin dependent [facility])   [facility] Alba Cory, MD   2 months ago No-show for appointment   [facility] Alba Cory, MD   6 months ago Type II diabetes mellitus with neuropathy causing erectile dysfunction [facility])   [facility] Alba Cory, MD   1 year ago Type II diabetes mellitus with neuropathy causing erectile dysfunction [facility])   [facility] Alba Cory, MD   1 year ago Hypercholesteremia   [facility] Alba Cory, MD      Future Appointments            In 2 months Carlynn Purl, Danna Hefty, MD [facility], [facility]

## 2020-10-21 ENCOUNTER — Other Ambulatory Visit: Payer: Self-pay | Admitting: Family Medicine

## 2020-10-21 ENCOUNTER — Telehealth: Payer: Self-pay

## 2020-10-21 DIAGNOSIS — N521 Erectile dysfunction due to diseases classified elsewhere: Secondary | ICD-10-CM

## 2020-10-21 DIAGNOSIS — E1140 Type 2 diabetes mellitus with diabetic neuropathy, unspecified: Secondary | ICD-10-CM

## 2020-10-21 MED ORDER — TRESIBA FLEXTOUCH 200 UNIT/ML ~~LOC~~ SOPN: 50.0000 [IU] | PEN_INJECTOR | Freq: Every day | SUBCUTANEOUS | 0 refills | Status: DC

## 2020-10-21 MED ORDER — VICTOZA 18 MG/3ML ~~LOC~~ SOPN: 1.8000 mg | PEN_INJECTOR | Freq: Every day | SUBCUTANEOUS | 1 refills | Status: DC

## 2020-10-21 NOTE — Telephone Encounter (Signed)
Pt was put on xultophy and states that it is expensive. He even tried to use the good rx and it was going to cost him $1100. He would like to know if you switch him to tresiba or something less expensie. He is going out of town tomorrow and need this today

## 2020-12-16 ENCOUNTER — Ambulatory Visit: Payer: 59 | Admitting: Family Medicine

## 2021-01-02 ENCOUNTER — Other Ambulatory Visit: Payer: Self-pay | Admitting: Family Medicine

## 2021-01-02 DIAGNOSIS — E7800 Pure hypercholesterolemia, unspecified: Secondary | ICD-10-CM

## 2021-01-07 ENCOUNTER — Ambulatory Visit: Payer: 59 | Admitting: Family Medicine

## 2021-01-07 NOTE — Progress Notes (Deleted)
Name: Joseph Johnson   MRN: [id number]    DOB: 1960-12-31   Date:01/07/2021       Progress Note  Subjective  Chief Complaint  Follow Up  HPI  DMII:He saw Dr. Tedd Sias in [place] 2019but because of cost of seeing sub-specialist  and A1C has improved he is back here for DM management. A1C was 6.8% ,7.4%, 6.8% and it went up to 8 % Spring 2021 today is 8.8 % . He is taking Jardiance daily, Novolog pre mealby sliding scalebefore lunch and dinner and Xultophy 50 units daily.He states his glucose fasting between 90-130, before dinner between 170's to low 200's. He states he has been working in [place] from M-F and is eating instant food or fast food. Discussed importance of healthier eating. He will plan his meals better, and he will start taking Jardiance daily to get glucose below 8 % by next visit , he understands that we will have to send him back to Endo if unable to bring glucose levels down He has associated HTN, dyslipidemia, ED . He is on ACE and statin therapy and Cialis   HTN: he has not been compliant with Lotrel lately, he is only taking two or three times a week.  Not checking bp at home lately. No chest pain, dizziness or palpitationHe is eating more processed food and bp is high today, usually at goal.   Hyperlipidemia:lipid panelwas great but he has not been compliant with medication lately, denies chest pain or myalgia.   ED: hestates Cialis prn and it works well for him, he would like a refill   Herpes Genitalis: he started to have outbreaks in his 20's, he states last episode was over two years ago, he had an episodes April 2021 but nothing since, he does not take suppressive medication since married and episodes are infrequent    Patient Active Problem List   Diagnosis Date Noted  . Type 2 diabetes mellitus with hyperglycemia, with long-term current use of insulin (HCC) 03/11/2018  . Allergic rhinitis, seasonal 03/03/2016  . ED (erectile dysfunction) 06/21/2015   . 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 Hypercholesteremia 06/20/2015  . Excess weight 06/20/2015  . Benign essential HTN 02/02/2008    No past surgical history on file.  Family History  Problem Relation Age of Onset  . Hypertension Mother   . Diabetes Father   . Diabetes Brother   . Hypercalcemia Brother   . Hypertension Brother     Social History   Tobacco Use  . Smoking status: Former Smoker    Types: Cigarettes    Quit date: 09/19/1990    Years since quitting: 30.3  . Smokeless tobacco: Never Used  . Tobacco comment: more than 20 years ago  Substance Use Topics  . Alcohol use: No    Alcohol/week: 0.0 standard drinks     Current Outpatient Medications:  .  ACCU-CHEK GUIDE test strip, 1 EACH BY OTHER ROUTE 4 (FOUR) TIMES DAILY - BEFORE MEALS AND AT BEDTIME. USE AS INSTRUCTED, Disp: 400 strip, Rfl: 3 .  amLODipine-benazepril (LOTREL) 5-40 MG capsule, Take 1 capsule by mouth daily., Disp: 90 capsule, Rfl: 1 .  aspirin 81 MG tablet, , Disp: , Rfl:  .  atorvastatin (LIPITOR) 80 MG tablet, TAKE 1 TABLET BY MOUTH EVERY DAY, Disp: 90 tablet, Rfl: 1 .  empagliflozin (JARDIANCE) 25 MG TABS tablet, Take 25 mg by mouth daily., Disp: 90 tablet, Rfl: 1 .  fluticasone (FLONASE)  50 MCG/ACT nasal spray, Place 2 sprays into both nostrils daily., Disp: 48 g, Rfl: 0 .  insulin degludec (TRESIBA FLEXTOUCH) 200 UNIT/ML FlexTouch Pen, Inject 50 Units into the skin daily., Disp: 15 mL, Rfl: 0 .  Insulin Pen Needle 32G X 6 MM MISC, 1 each by Does not apply route daily., Disp: 100 each, Rfl: 1 .  Insulin Syringe-Needle U-100 (BD INSULIN SYRINGE U/F) 31G X 5/16" 0.5 ML MISC, USE 4 TIMES A DAY, Disp: 400 each, Rfl: 3 .  levocetirizine (XYZAL) 5 MG tablet, TAKE 1 TABLET (5 MG TOTAL) BY MOUTH EVERY EVENING., Disp: 90 tablet, Rfl: 0 .  liraglutide (VICTOZA) 18 MG/3ML SOPN, Inject 1.8 mg into the skin daily. In place of Xultophy, Disp: 9 mL, Rfl:  1 .  NOVOLOG FLEXPEN 100 UNIT/ML FlexPen, INJECT 3-15 UNITS INTO THE SKIN 3 (THREE) TIMES DAILY WITH MEALS., Disp: 45 mL, Rfl: 0 .  tadalafil (CIALIS) 20 MG tablet, Take 1 tablet (20 mg total) by mouth daily as needed for erectile dysfunction., Disp: 30 tablet, Rfl: 0 .  valACYclovir (VALTREX) 500 MG tablet, Take 1 tablet (500 mg total) by mouth 3 (three) times daily., Disp: 30 tablet, Rfl: 2  No Known Allergies  I personally reviewed {Reviewed:14835} with the patient/caregiver today.   ROS  ***  Objective  There were no vitals filed for this visit.  There is no height or weight on file to calculate BMI.  Physical Exam ***  No results found for this or any previous visit (from the past 2160 hour(s)).  Diabetic Foot Exam: Diabetic Foot Exam - Simple   No data filed    ***  PHQ2/9: Depression screen [facility] 2/9 08/30/2020 03/20/2020 10/09/2019 07/04/2019 03/06/2019  Decreased Interest 0 2 0 0 0  Down, Depressed, Hopeless 0 0 0 0 0  PHQ - 2 Score 0 2 0 0 0  Altered sleeping - 0 0 0 0  Tired, decreased energy - 0 0 0 0  Change in appetite - 0 0 0 0  Feeling bad or failure about yourself  - 0 0 0 0  Trouble concentrating - 0 0 0 0  Moving slowly or fidgety/restless - 0 0 0 0  Suicidal thoughts - 0 0 0 0  PHQ-9 Score - 2 0 0 0  Difficult doing work/chores - Not difficult at all - - Not difficult at all  Some recent data might be hidden    phq 9 is {gen pos [id number]} ***  Fall Risk: Fall Risk  08/30/2020 03/20/2020 10/09/2019 07/04/2019 03/06/2019  Falls in the past year? 0 0 0 0 0  Number falls in past yr: 0 0 0 0 0  Injury with Fall? 0 0 0 0 0   ***   Functional Status Survey:   ***   Assessment & Plan  *** There are no diagnoses linked to this encounter.

## 2021-01-12 ENCOUNTER — Other Ambulatory Visit: Payer: Self-pay | Admitting: Family Medicine

## 2021-01-12 DIAGNOSIS — N521 Erectile dysfunction due to diseases classified elsewhere: Secondary | ICD-10-CM

## 2021-01-28 ENCOUNTER — Other Ambulatory Visit: Payer: Self-pay | Admitting: Family Medicine

## 2021-01-28 DIAGNOSIS — E1140 Type 2 diabetes mellitus with diabetic neuropathy, unspecified: Secondary | ICD-10-CM

## 2021-01-28 DIAGNOSIS — N521 Erectile dysfunction due to diseases classified elsewhere: Secondary | ICD-10-CM

## 2021-01-29 NOTE — Telephone Encounter (Signed)
Requested Prescriptions  Pending Prescriptions Disp Refills  . NOVOLOG FLEXPEN 100 UNIT/ML FlexPen [Pharmacy Med Name: NOVOLOG 100 UNIT/ML FLEXPEN]      Sig: INJECT 3-15 UNITS INTO THE SKIN 3 (THREE) TIMES DAILY WITH MEALS.     Endocrinology:  Diabetes - Insulins Failed - 01/28/2021  5:27 PM      Failed - HBA1C is between 0 and 7.9 and within 180 days    Hemoglobin A1C  Date Value Ref Range Status  08/30/2020 8.8 (A) 4.0 - 5.6 % Final   HbA1c, POC (controlled diabetic range)  Date Value Ref Range Status  07/04/2019 6.8 0.0 - 7.0 % Final   Hgb A1c MFr Bld  Date Value Ref Range Status  03/20/2020 8.0 (H) <5.7 % of total Hgb Final    Comment:    For someone without known diabetes, a hemoglobin A1c value of 6.5% or greater indicates that they may have  diabetes and this should be confirmed with a follow-up  test. . For someone with known diabetes, a value <7% indicates  that their diabetes is well controlled and a value  greater than or equal to 7% indicates suboptimal  control. A1c targets should be individualized based on  duration of diabetes, age, comorbid conditions, and  other considerations. . Currently, no consensus exists regarding use of hemoglobin A1c for diagnosis of diabetes for children. Verna Czech - Valid encounter within last 6 months    Recent Outpatient Visits          5 months ago Diabetes mellitus type 2, insulin dependent [facility])   [facility] Alba Cory, MD   6 months ago No-show for appointment   [facility] Alba Cory, MD   10 months ago Type II diabetes mellitus with neuropathy causing erectile dysfunction [facility])   [facility] Alba Cory, MD   1 year ago Type II diabetes mellitus with neuropathy causing erectile dysfunction [facility])   [facility] Alba Cory, MD   1 year ago Hypercholesteremia   [facility] Alba Cory, MD       Future Appointments            In 1 week Alba Cory, MD [facility], [facility]

## 2021-02-10 NOTE — Progress Notes (Deleted)
Name: Joseph Johnson   MRN: [id number]    DOB: December 25, 1960   Date:02/10/2021       Progress Note  Subjective  Chief Complaint  Follow Up  HPI  DMII:He saw Dr. Tedd Sias in [place] 2019but because of cost of seeing sub-specialist  and A1C has improved he is back here for DM management. A1C was 6.8% ,7.4%, 6.8% and it went up to 8 % Spring 2021 today is 8.8 % . He is taking Jardiance daily, Novolog pre mealby sliding scalebefore lunch and dinner and Xultophy 50 units daily.He states his glucose fasting between 90-130, before dinner between 170's to low 200's. He states he has been working in [place] from M-F and is eating instant food or fast food. Discussed importance of healthier eating. He will plan his meals better, and he will start taking Jardiance daily to get glucose below 8 % by next visit , he understands that we will have to send him back to Endo if unable to bring glucose levels down He has associated HTN, dyslipidemia, ED . He is on ACE and statin therapy and Cialis   HTN: he has not been compliant with Lotrel lately, he is only taking two or three times a week.  Not checking bp at home lately. No chest pain, dizziness or palpitationHe is eating more processed food and bp is high today, usually at goal.   Hyperlipidemia:lipid panelwas great but he has not been compliant with medication lately, denies chest pain or myalgia.   ED: hestates Cialis prn and it works well for him, he would like a refill   Herpes Genitalis: he started to have outbreaks in his 20's, he states last episode was over two years ago, he had an episodes April 2021 but nothing since, he does not take suppressive medication since married and episodes are infrequent  Patient Active Problem List   Diagnosis Date Noted  . Type 2 diabetes mellitus with hyperglycemia, with long-term current use of insulin (HCC) 03/11/2018  . Allergic rhinitis, seasonal 03/03/2016  . ED (erectile dysfunction) 06/21/2015  .  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 Hypercholesteremia 06/20/2015  . Excess weight 06/20/2015  . Benign essential HTN 02/02/2008    No past surgical history on file.  Family History  Problem Relation Age of Onset  . Hypertension Mother   . Diabetes Father   . Diabetes Brother   . Hypercalcemia Brother   . Hypertension Brother     Social History   Tobacco Use  . Smoking status: Former Smoker    Types: Cigarettes    Quit date: 09/19/1990    Years since quitting: 30.4  . Smokeless tobacco: Never Used  . Tobacco comment: more than 20 years ago  Substance Use Topics  . Alcohol use: No    Alcohol/week: 0.0 standard drinks     Current Outpatient Medications:  .  ACCU-CHEK GUIDE test strip, 1 EACH BY OTHER ROUTE 4 (FOUR) TIMES DAILY - BEFORE MEALS AND AT BEDTIME. USE AS INSTRUCTED, Disp: 400 strip, Rfl: 3 .  amLODipine-benazepril (LOTREL) 5-40 MG capsule, Take 1 capsule by mouth daily., Disp: 90 capsule, Rfl: 1 .  aspirin 81 MG tablet, , Disp: , Rfl:  .  atorvastatin (LIPITOR) 80 MG tablet, TAKE 1 TABLET BY MOUTH EVERY DAY, Disp: 90 tablet, Rfl: 1 .  fluticasone (FLONASE) 50 MCG/ACT nasal spray, Place 2 sprays into both nostrils daily., Disp: 48 g, Rfl: 0 .  insulin degludec (TRESIBA  FLEXTOUCH) 200 UNIT/ML FlexTouch Pen, Inject 50 Units into the skin daily., Disp: 15 mL, Rfl: 0 .  Insulin Pen Needle 32G X 6 MM MISC, 1 each by Does not apply route daily., Disp: 100 each, Rfl: 1 .  Insulin Syringe-Needle U-100 (BD INSULIN SYRINGE U/F) 31G X 5/16" 0.5 ML MISC, USE 4 TIMES A DAY, Disp: 400 each, Rfl: 3 .  JARDIANCE 25 MG TABS tablet, TAKE 25 MG BY MOUTH DAILY., Disp: 90 tablet, Rfl: 0 .  levocetirizine (XYZAL) 5 MG tablet, TAKE 1 TABLET (5 MG TOTAL) BY MOUTH EVERY EVENING., Disp: 90 tablet, Rfl: 0 .  liraglutide (VICTOZA) 18 MG/3ML SOPN, Inject 1.8 mg into the skin daily. In place of Xultophy, Disp: 9 mL, Rfl: 1 .  NOVOLOG FLEXPEN  100 UNIT/ML FlexPen, INJECT 3-15 UNITS INTO THE SKIN 3 (THREE) TIMES DAILY WITH MEALS., Disp: 45 mL, Rfl: 0 .  tadalafil (CIALIS) 20 MG tablet, Take 1 tablet (20 mg total) by mouth daily as needed for erectile dysfunction., Disp: 30 tablet, Rfl: 0 .  valACYclovir (VALTREX) 500 MG tablet, Take 1 tablet (500 mg total) by mouth 3 (three) times daily., Disp: 30 tablet, Rfl: 2  No Known Allergies  I personally reviewed {Reviewed:14835} with the patient/caregiver today.   ROS  ***  Objective  There were no vitals filed for this visit.  There is no height or weight on file to calculate BMI.  Physical Exam ***  No results found for this or any previous visit (from the past 2160 hour(s)).  Diabetic Foot Exam: Diabetic Foot Exam - Simple   No data filed    ***  PHQ2/9: Depression screen [facility] 2/9 08/30/2020 03/20/2020 10/09/2019 07/04/2019 03/06/2019  Decreased Interest 0 2 0 0 0  Down, Depressed, Hopeless 0 0 0 0 0  PHQ - 2 Score 0 2 0 0 0  Altered sleeping - 0 0 0 0  Tired, decreased energy - 0 0 0 0  Change in appetite - 0 0 0 0  Feeling bad or failure about yourself  - 0 0 0 0  Trouble concentrating - 0 0 0 0  Moving slowly or fidgety/restless - 0 0 0 0  Suicidal thoughts - 0 0 0 0  PHQ-9 Score - 2 0 0 0  Difficult doing work/chores - Not difficult at all - - Not difficult at all  Some recent data might be hidden    phq 9 is {gen pos [id number]} ***  Fall Risk: Fall Risk  08/30/2020 03/20/2020 10/09/2019 07/04/2019 03/06/2019  Falls in the past year? 0 0 0 0 0  Number falls in past yr: 0 0 0 0 0  Injury with Fall? 0 0 0 0 0   ***   Functional Status Survey:   ***   Assessment & Plan  *** There are no diagnoses linked to this encounter.

## 2021-02-11 ENCOUNTER — Ambulatory Visit: Payer: 59 | Admitting: Family Medicine

## 2021-02-19 NOTE — Progress Notes (Unsigned)
Name: Joseph Johnson   MRN: [id number]    DOB: 03-02-1961   Date:02/20/2021       Progress Note  Subjective  Chief Complaint  Follow Up  HPI  DMII:He saw Dr. Tedd Sias in [place] 2019but because of cost of seeing sub-specialist  and A1C has improved he is back here for DM management. A1C was 6.8% ,7.4%, 6.8% and it went up to 8 % Spring 2021 it went up to 8.8 %, today is down to 7.9 %. He states no longer working [employer] from M-F , therefore it is easier to eat healthier, he just got a different job site about 6 weeks ago.   HTN: he has not been compliant with Lotrel lately, he is only taking two or three times a week.  Not checking bp at home lately. No chest pain, dizziness or palpitationHe is eating more processed food and bp is high today, usually at goal.   Hyperlipidemia:lipid panelwas great but he has not been compliant with medication lately, denies chest pain or myalgia.   ED: hestates Cialis prn and it works well for him, he would like a refill   GERD/black stools:  he lost his brother Saturday and states since he got the news he has been feeling very nauseated and unable to eat. He also has noticed increase in heartburn, he had one episode of black stools a few months ago, lasted a few days   Herpes Genitalis: he started to have outbreaks in his 20's, he states last episode was over two years ago, he had an episodes April 2021 but nothing since, he does not take suppressive medication since married and episodes are infrequent   Patient Active Problem List   Diagnosis Date Noted  . Type 2 diabetes mellitus with hyperglycemia, with long-term current use of insulin (HCC) 03/11/2018  . Allergic rhinitis, seasonal 03/03/2016  . ED (erectile dysfunction) 06/21/2015  . 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 Hypercholesteremia 06/20/2015  . Excess weight 06/20/2015  . Benign essential HTN 02/02/2008    History  reviewed. No pertinent surgical history.  Family History  Problem Relation Age of Onset  . Hypertension Mother   . Diabetes Father   . Diabetes Brother   . Hypercalcemia Brother   . Hypertension Brother     Social History   Tobacco Use  . Smoking status: Former Smoker    Types: Cigarettes    Quit date: 09/19/1990    Years since quitting: 30.4  . Smokeless tobacco: Never Used  . Tobacco comment: more than 20 years ago  Substance Use Topics  . Alcohol use: No    Alcohol/week: 0.0 standard drinks     Current Outpatient Medications:  .  ACCU-CHEK GUIDE test strip, 1 EACH BY OTHER ROUTE 4 (FOUR) TIMES DAILY - BEFORE MEALS AND AT BEDTIME. USE AS INSTRUCTED, Disp: 400 strip, Rfl: 3 .  amLODipine-benazepril (LOTREL) 5-40 MG capsule, Take 1 capsule by mouth daily., Disp: 90 capsule, Rfl: 1 .  aspirin 81 MG tablet, , Disp: , Rfl:  .  atorvastatin (LIPITOR) 80 MG tablet, TAKE 1 TABLET BY MOUTH EVERY DAY, Disp: 90 tablet, Rfl: 1 .  fluticasone (FLONASE) 50 MCG/ACT nasal spray, Place 2 sprays into both nostrils daily., Disp: 48 g, Rfl: 0 .  Insulin Pen Needle 32G X 6 MM MISC, 1 each by Does not apply route daily., Disp: 100 each, Rfl: 1 .  Insulin Syringe-Needle U-100 (BD INSULIN SYRINGE U/F) 31G  X 5/16" 0.5 ML MISC, USE 4 TIMES A DAY, Disp: 400 each, Rfl: 3 .  JARDIANCE 25 MG TABS tablet, TAKE 25 MG BY MOUTH DAILY., Disp: 90 tablet, Rfl: 0 .  levocetirizine (XYZAL) 5 MG tablet, TAKE 1 TABLET (5 MG TOTAL) BY MOUTH EVERY EVENING., Disp: 90 tablet, Rfl: 0 .  NOVOLOG FLEXPEN 100 UNIT/ML FlexPen, INJECT 3-15 UNITS INTO THE SKIN 3 (THREE) TIMES DAILY WITH MEALS., Disp: 45 mL, Rfl: 0 .  tadalafil (CIALIS) 20 MG tablet, Take 1 tablet (20 mg total) by mouth daily as needed for erectile dysfunction., Disp: 30 tablet, Rfl: 0 .  valACYclovir (VALTREX) 500 MG tablet, Take 1 tablet (500 mg total) by mouth 3 (three) times daily., Disp: 30 tablet, Rfl: 2 .  XULTOPHY 100-3.6 UNIT-MG/ML SOPN, Inject into  the skin., Disp: , Rfl:  .  insulin degludec (TRESIBA FLEXTOUCH) 200 UNIT/ML FlexTouch Pen, Inject 50 Units into the skin daily. (Patient not taking: Reported on 02/20/2021), Disp: 15 mL, Rfl: 0 .  liraglutide (VICTOZA) 18 MG/3ML SOPN, Inject 1.8 mg into the skin daily. In place of Xultophy (Patient not taking: Reported on 02/20/2021), Disp: 9 mL, Rfl: 1  No Known Allergies  I personally reviewed active problem list, medication list, allergies, family history, social history, health maintenance with the patient/caregiver today.   ROS  Constitutional: Negative for fever or weight change.  Respiratory: Negative for cough and shortness of breath.   Cardiovascular: Negative for chest pain or palpitations.  Gastrointestinal: Negative for abdominal pain ( only heartburn) , no bowel changes.  Musculoskeletal:negative   for gait problem or joint swelling.  Skin: Negative for rash.  Neurological: Negative for dizziness or headache.  No other specific complaints in a complete review of systems (except as listed in HPI above).  Objective  Vitals:   02/20/21 1130  BP: 120/78  Pulse: 88  Resp: 16  Temp: 98.5 F (36.9 C)  SpO2: 98%  Weight: 204 lb 3.2 oz (92.6 kg)  Height: 5\' 11"  (1.803 m)    Body mass index is 28.48 kg/m.  Physical Exam  Constitutional: Patient appears well-developed and well-nourished.  No distress.  HEENT: head atraumatic, normocephalic, pupils equal and reactive to light, neck supple Cardiovascular: Normal rate, regular rhythm and normal heart sounds.  No murmur heard. No BLE edema. Pulmonary/Chest: Effort normal and breath sounds normal. No respiratory distress. Abdominal: Soft.  There is no tenderness. Psychiatric: Patient has a normal mood and affect. behavior is normal. Judgment and thought content normal.  Recent Results (from the past 2160 hour(s))  POCT HgB A1C     Status: Abnormal   Collection Time: 02/20/21 11:43 AM  Result Value Ref Range   Hemoglobin  A1C 7.9 (A) 4.0 - 5.6 %   HbA1c POC (<> result, manual entry)     HbA1c, POC (prediabetic range)     HbA1c, POC (controlled diabetic range)       PHQ2/9: Depression screen [facility] 2/9 02/20/2021 08/30/2020 03/20/2020 10/09/2019 07/04/2019  Decreased Interest 0 0 2 0 0  Down, Depressed, Hopeless 0 0 0 0 0  PHQ - 2 Score 0 0 2 0 0  Altered sleeping 0 - 0 0 0  Tired, decreased energy 0 - 0 0 0  Change in appetite 0 - 0 0 0  Feeling bad or failure about yourself  0 - 0 0 0  Trouble concentrating 0 - 0 0 0  Moving slowly or fidgety/restless 0 - 0 0 0  Suicidal thoughts 0 -  0 0 0  PHQ-9 Score 0 - 2 0 0  Difficult doing work/chores Not difficult at all - Not difficult at all - -  Some recent data might be hidden    phq 9 is negative   Fall Risk: Fall Risk  02/20/2021 08/30/2020 03/20/2020 10/09/2019 07/04/2019  Falls in the past year? 0 0 0 0 0  Number falls in past yr: 0 0 0 0 0  Injury with Fall? 0 0 0 0 0     Functional Status Survey: Is the patient deaf or have difficulty hearing?: No Does the patient have difficulty seeing, even when wearing glasses/contacts?: No Does the patient have difficulty concentrating, remembering, or making decisions?: No Does the patient have difficulty walking or climbing stairs?: No Does the patient have difficulty dressing or bathing?: No Does the patient have difficulty doing errands alone such as visiting a doctor's office or shopping?: No    Assessment & Plan  1. 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  Dispense: 90 tablet; Refill: 0 - tadalafil (CIALIS) 20 MG tablet; Take 1 tablet (20 mg total) by mouth daily as needed for erectile dysfunction.  Dispense: 30 tablet; Refill: 0  2. Benign essential HTN  - amLODipine-benazepril (LOTREL) 5-40 MG capsule; Take 1 capsule by mouth daily.  Dispense: 90 capsule;  Refill: 1  3. ED (erectile dysfunction) of organic origin  - tadalafil (CIALIS) 20 MG tablet; Take 1 tablet (20 mg total) by mouth daily as needed for erectile dysfunction.  Dispense: 30 tablet; Refill: 0  4. Dyslipidemia associated with type 2 diabetes mellitus (HCC)  - Lipid panel - Microalbumin / creatinine urine ratio  5. Hypertension complicating diabetes (HCC)  - COMPLETE METABOLIC PANEL WITH GFR - CBC with Differential/Platelet  6. Dyspepsia  - CBC with Differential/Platelet - POCT Occult Blood Stool - H. pylori breath test - omeprazole (PRILOSEC) 40 MG capsule; Take 1 capsule (40 mg total) by mouth daily.  Dispense: 90 capsule; Refill: 0  7. Gastroesophageal reflux disease without esophagitis  - omeprazole (PRILOSEC) 40 MG capsule; Take 1 capsule (40 mg total) by mouth daily.  Dispense: 90 capsule; Refill: 0  8. Nausea  - ondansetron (ZOFRAN) 4 MG tablet; Take 1 tablet (4 mg total) by mouth every 8 (eight) hours as needed for nausea or vomiting.  Dispense: 20 tablet; Refill: 0  9. Grieving

## 2021-02-20 ENCOUNTER — Ambulatory Visit: Payer: 59 | Admitting: Family Medicine

## 2021-02-20 ENCOUNTER — Encounter: Payer: Self-pay | Admitting: Family Medicine

## 2021-02-20 ENCOUNTER — Other Ambulatory Visit: Payer: Self-pay

## 2021-02-20 VITALS — BP 120/78 | HR 88 | Temp 98.5°F | Resp 16 | Ht 71.0 in | Wt 204.2 lb

## 2021-02-20 DIAGNOSIS — R1013 Epigastric pain: Secondary | ICD-10-CM

## 2021-02-20 DIAGNOSIS — N529 Male erectile dysfunction, unspecified: Secondary | ICD-10-CM | POA: Diagnosis not present

## 2021-02-20 DIAGNOSIS — E785 Hyperlipidemia, unspecified: Secondary | ICD-10-CM

## 2021-02-20 DIAGNOSIS — I10 Essential (primary) hypertension: Secondary | ICD-10-CM | POA: Diagnosis not present

## 2021-02-20 DIAGNOSIS — E1159 Type 2 diabetes mellitus with other circulatory complications: Secondary | ICD-10-CM

## 2021-02-20 DIAGNOSIS — F4321 Adjustment disorder with depressed mood: Secondary | ICD-10-CM

## 2021-02-20 DIAGNOSIS — E1140 Type 2 diabetes mellitus with diabetic neuropathy, unspecified: Secondary | ICD-10-CM

## 2021-02-20 DIAGNOSIS — K219 Gastro-esophageal reflux disease without esophagitis: Secondary | ICD-10-CM

## 2021-02-20 DIAGNOSIS — I152 Hypertension secondary to endocrine disorders: Secondary | ICD-10-CM

## 2021-02-20 DIAGNOSIS — N521 Erectile dysfunction due to diseases classified elsewhere: Secondary | ICD-10-CM | POA: Diagnosis not present

## 2021-02-20 DIAGNOSIS — E1169 Type 2 diabetes mellitus with other specified complication: Secondary | ICD-10-CM | POA: Diagnosis not present

## 2021-02-20 DIAGNOSIS — R110 Nausea: Secondary | ICD-10-CM

## 2021-02-20 LAB — POCT GLYCOSYLATED HEMOGLOBIN (HGB A1C): Hemoglobin A1C: 7.9 % — AB (ref 4.0–5.6)

## 2021-02-20 MED ORDER — OMEPRAZOLE 40 MG PO CPDR: 40.0000 mg | DELAYED_RELEASE_CAPSULE | Freq: Every day | ORAL | 0 refills | Status: DC

## 2021-02-20 MED ORDER — ONDANSETRON HCL 4 MG PO TABS: 4.0000 mg | ORAL_TABLET | Freq: Three times a day (TID) | ORAL | 0 refills | Status: DC | PRN

## 2021-02-20 MED ORDER — AMLODIPINE BESY-BENAZEPRIL HCL 5-40 MG PO CAPS: 1.0000 | ORAL_CAPSULE | Freq: Every day | ORAL | 1 refills | Status: DC

## 2021-02-20 MED ORDER — TADALAFIL 20 MG PO TABS: 20.0000 mg | ORAL_TABLET | Freq: Every day | ORAL | 0 refills | Status: DC | PRN

## 2021-02-20 MED ORDER — EMPAGLIFLOZIN 25 MG PO TABS: 25.0000 mg | ORAL_TABLET | Freq: Every day | ORAL | 0 refills | Status: DC

## 2021-02-21 LAB — COMPLETE METABOLIC PANEL WITH GFR
AG Ratio: 1.8 (calc) (ref 1.0–2.5)
ALT: 15 U/L (ref 9–46)
AST: 19 U/L (ref 10–35)
Albumin: 4.3 g/dL (ref 3.6–5.1)
Alkaline phosphatase (APISO): 62 U/L (ref 35–144)
BUN: 14 mg/dL (ref 7–25)
CO2: 28 mmol/L (ref 20–32)
Calcium: 9.2 mg/dL (ref 8.6–10.3)
Chloride: 107 mmol/L (ref 98–110)
Creat: 0.89 mg/dL (ref 0.70–1.33)
GFR, Est African American: 110 mL/min/{1.73_m2} (ref >=60)
GFR, Est Non African American: 95 mL/min/{1.73_m2} (ref >=60)
Globulin: 2.4 g/dL (calc) (ref 1.9–3.7)
Glucose, Bld: 124 mg/dL — ABNORMAL HIGH (ref 65–99)
Potassium: 4.2 mmol/L (ref 3.5–5.3)
Sodium: 143 mmol/L (ref 135–146)
Total Bilirubin: 1.4 mg/dL — ABNORMAL HIGH (ref 0.2–1.2)
Total Protein: 6.7 g/dL (ref 6.1–8.1)

## 2021-02-21 LAB — CBC WITH DIFFERENTIAL/PLATELET
Absolute Monocytes: 250 cells/uL (ref 200–950)
Basophils Absolute: 10 cells/uL (ref 0–200)
Basophils Relative: 0.2 %
Eosinophils Absolute: 19 cells/uL (ref 15–500)
Eosinophils Relative: 0.4 %
HCT: 46.7 % (ref 38.5–50.0)
Hemoglobin: 15.5 g/dL (ref 13.2–17.1)
Lymphs Abs: 1378 cells/uL (ref 850–3900)
MCH: 30 pg (ref 27.0–33.0)
MCHC: 33.2 g/dL (ref 32.0–36.0)
MCV: 90.5 fL (ref 80.0–100.0)
MPV: 12.7 fL — ABNORMAL HIGH (ref 7.5–12.5)
Monocytes Relative: 5.2 %
Neutro Abs: 3144 cells/uL (ref 1500–7800)
Neutrophils Relative %: 65.5 %
Platelets: 177 10*3/uL (ref 140–400)
RBC: 5.16 10*6/uL (ref 4.20–5.80)
RDW: 13.1 % (ref 11.0–15.0)
Total Lymphocyte: 28.7 %
WBC: 4.8 10*3/uL (ref 3.8–10.8)

## 2021-02-21 LAB — H. PYLORI BREATH TEST: H. pylori Breath Test: NOT DETECTED

## 2021-02-21 LAB — LIPID PANEL
Cholesterol: 135 mg/dL (ref <200)
HDL: 47 mg/dL (ref >=40)
LDL Cholesterol (Calc): 70 mg/dL (calc)
Non-HDL Cholesterol (Calc): 88 mg/dL (calc) (ref <130)
Total CHOL/HDL Ratio: 2.9 (calc) (ref <5.0)
Triglycerides: 101 mg/dL (ref <150)

## 2021-02-21 LAB — MICROALBUMIN / CREATININE URINE RATIO
Creatinine, Urine: 99 mg/dL (ref 20–320)
Microalb Creat Ratio: 3 mcg/mg creat (ref <30)
Microalb, Ur: 0.3 mg/dL

## 2021-03-25 ENCOUNTER — Ambulatory Visit (INDEPENDENT_AMBULATORY_CARE_PROVIDER_SITE_OTHER): Payer: 59

## 2021-03-25 DIAGNOSIS — R1013 Epigastric pain: Secondary | ICD-10-CM

## 2021-03-25 LAB — POC HEMOCCULT BLD/STL (HOME/3-CARD/SCREEN)
Card #1 Date: 4112022
Card #2 Date: 4122022
Card #2 Fecal Occult Blod, POC: NEGATIVE
Card #3 Date: 4132022
Card #3 Fecal Occult Blood, POC: NEGATIVE
Fecal Occult Blood, POC: NEGATIVE

## 2021-05-02 ENCOUNTER — Ambulatory Visit: Payer: Self-pay | Admitting: *Deleted

## 2021-05-02 ENCOUNTER — Encounter: Payer: Self-pay | Admitting: Family Medicine

## 2021-05-02 ENCOUNTER — Other Ambulatory Visit: Payer: Self-pay | Admitting: Family Medicine

## 2021-05-02 DIAGNOSIS — E1140 Type 2 diabetes mellitus with diabetic neuropathy, unspecified: Secondary | ICD-10-CM

## 2021-05-02 NOTE — Telephone Encounter (Signed)
Requested medication (s) are due for refill today: yes  Requested medication (s) are on the active medication list: yes  Last refill: yes  Future visit scheduled: yes  Notes to clinic: historical provider    Requested Prescriptions  Pending Prescriptions Disp Refills   XULTOPHY 100-3.6 UNIT-MG/ML SOPN [Pharmacy Med Name: XULTOPHY 100 UNIT-3.6MG /ML PEN]  1    Sig: INJECT 50 UNITS INTO THE SKIN DAILY.      Endocrinology:  Diabetes - Insulin + GLP-1 Receptor Agonist Combos 1 Passed - 05/02/2021 12:53 PM      Passed - HBA1C is between 0 and 7.9 and within 180 days    Hemoglobin A1C  Date Value Ref Range Status  02/20/2021 7.9 (A) 4.0 - 5.6 % Final   HbA1c, POC (controlled diabetic range)  Date Value Ref Range Status  07/04/2019 6.8 0.0 - 7.0 % Final   Hgb A1c MFr Bld  Date Value Ref Range Status  03/20/2020 8.0 (H) <5.7 % of total Hgb Final    Comment:    For someone without known diabetes, a hemoglobin A1c value of 6.5% or greater indicates that they may have  diabetes and this should be confirmed with a follow-up  test. . For someone with known diabetes, a value <7% indicates  that their diabetes is well controlled and a value  greater than or equal to 7% indicates suboptimal  control. A1c targets should be individualized based on  duration of diabetes, age, comorbid conditions, and  other considerations. . Currently, no consensus exists regarding use of hemoglobin A1c for diagnosis of diabetes for children. Verna Czech - Valid encounter within last 6 months    Recent Outpatient Visits           2 months ago Type II diabetes mellitus with neuropathy causing erectile dysfunction [facility])   [facility] Alba Cory, MD   8 months ago Diabetes mellitus type 2, insulin dependent [facility])   [facility] Alba Cory, MD   9 months ago No-show for appointment   [facility] Alba Cory, MD   1  year ago Type II diabetes mellitus with neuropathy causing erectile dysfunction [facility])   [facility] Alba Cory, MD   1 year ago Type II diabetes mellitus with neuropathy causing erectile dysfunction [facility])   [facility] Alba Cory, MD       Future Appointments             In 1 month Carlynn Purl, Danna Hefty, MD [facility], [facility]

## 2021-05-02 NOTE — Telephone Encounter (Signed)
Pharmacy called in on behalf of the patient to get refill today of medication below. Please advise

## 2021-05-02 NOTE — Telephone Encounter (Signed)
Did not talk with pt    Answered a question for the agent.

## 2021-05-06 ENCOUNTER — Other Ambulatory Visit: Payer: Self-pay | Admitting: Family Medicine

## 2021-05-06 DIAGNOSIS — E1140 Type 2 diabetes mellitus with diabetic neuropathy, unspecified: Secondary | ICD-10-CM

## 2021-05-06 NOTE — Telephone Encounter (Signed)
Pt has an appt on 06/06/21 

## 2021-05-06 NOTE — Telephone Encounter (Signed)
Pt verbally informed that prescription has been sent to pharmacy 

## 2021-05-08 ENCOUNTER — Telehealth: Payer: Self-pay

## 2021-05-08 ENCOUNTER — Other Ambulatory Visit: Payer: Self-pay

## 2021-05-08 ENCOUNTER — Other Ambulatory Visit: Payer: Self-pay | Admitting: Family Medicine

## 2021-05-08 DIAGNOSIS — N521 Erectile dysfunction due to diseases classified elsewhere: Secondary | ICD-10-CM

## 2021-05-08 DIAGNOSIS — E1140 Type 2 diabetes mellitus with diabetic neuropathy, unspecified: Secondary | ICD-10-CM

## 2021-05-08 MED ORDER — XULTOPHY 100-3.6 UNIT-MG/ML ~~LOC~~ SOPN: PEN_INJECTOR | SUBCUTANEOUS | 0 refills | Status: DC

## 2021-05-08 NOTE — Telephone Encounter (Signed)
Spoke with drug rep, Pharmacist and Patient. Got his Xultophy down to $74. Patient stated that was much more affordable and was very thankful for the assistance. He was advised to contact us going further with any needs, he was in agreement.

## 2021-06-02 ENCOUNTER — Other Ambulatory Visit: Payer: Self-pay | Admitting: Family Medicine

## 2021-06-02 DIAGNOSIS — E1140 Type 2 diabetes mellitus with diabetic neuropathy, unspecified: Secondary | ICD-10-CM

## 2021-06-03 ENCOUNTER — Other Ambulatory Visit: Payer: Self-pay | Admitting: Family Medicine

## 2021-06-03 DIAGNOSIS — R1013 Epigastric pain: Secondary | ICD-10-CM

## 2021-06-03 DIAGNOSIS — K219 Gastro-esophageal reflux disease without esophagitis: Secondary | ICD-10-CM

## 2021-06-05 NOTE — Progress Notes (Signed)
Name: Joseph Johnson   MRN: [id number]    DOB: 1961/08/02   Date:06/06/2021       Progress Note  Subjective  Chief Complaint  Follow Up  HPI  DMII: He saw Dr. Tedd Sias in June of 2019 but because of cost of seeing sub-specialist  and A1C has improved he is back here for DM management.  A1C was 6.8% ,7.4%, 6.8% and it went up to 8 % Spring 2021 it went up to 8.8 % down to  7.9 % today is 7.5 % . He states no longer working [employer] from M-F , therefore it is easier to eat healthier. He denies polyphagia, polydipsia or polyuria, checking fsbs twice daily, fasting has been around 110. He has ED and takes Cialis, he is on statin therapy, he was on Lotrel but ran out of medication a couple of weeks ago, bp is at goal today, we will try switching to Benicar and offered him to come by to check bp, but he will check it at home and send me the reading.    HTN: off Lotrel for a couple of weeks, bp is at goal , we will switch to benicar 20 and monitor. No chest pain , dizziness or palpitation   Hyperlipidemia: lipid panel was at goal, he states compliant with medication now, no myalgia  ED: he states Cialis prn and it works well for him, he would like a refill   GERD/black stools:  he is taking Omeprazole since last visit and dyspepsia resolved, no longer had black stools, hemoccult cards negative and up to date with colonoscopy    Herpes Genitalis: he started to have outbreaks in his 20's, he states last episode was over two years ago, he had an episodes April 2021 but nothing since, he does not take suppressive medication since married and episodes are infrequent Unchanged  Patient Active Problem List   Diagnosis Date Noted   Type 2 diabetes mellitus with hyperglycemia, with long-term current use of insulin (HCC) 03/11/2018   Allergic rhinitis, seasonal 03/03/2016   ED (erectile dysfunction) 06/21/2015   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 No pertinent surgical history.  Family History  Problem Relation Age of Onset   Hypertension Mother    Diabetes Father    Diabetes Brother    Hypercalcemia Brother    Hypertension Brother     Social History   Tobacco Use   Smoking status: Former    Pack years: 0.00    Types: Cigarettes    Quit date: 09/19/1990    Years since quitting: 30.7   Smokeless tobacco: Never   Tobacco comments:    more than 20 years ago  Substance Use Topics   Alcohol use: No    Alcohol/week: 0.0 standard drinks     Current Outpatient Medications:    ACCU-CHEK GUIDE test strip, 1 EACH BY OTHER ROUTE 4 (FOUR) TIMES DAILY - BEFORE MEALS AND AT BEDTIME. USE AS INSTRUCTED, Disp: 400 strip, Rfl: 3   aspirin 81 MG tablet, , Disp: , Rfl:    BD PEN NEEDLE MICRO U/F 32G X 6 MM MISC, USE AS DIRECTED, Disp: 100 each, Rfl: 1   Continuous Blood Gluc Sensor (DEXCOM G6 SENSOR) MISC, 9 each by Does not apply route as directed. Every 10 days, Disp: 9 each, Rfl: 3   Continuous Blood Gluc  Transmit (DEXCOM G6 TRANSMITTER) MISC, 1 each by Does not apply route every 30 (thirty) days., Disp: 3 each, Rfl: 3   fluticasone (FLONASE) 50 MCG/ACT nasal spray, Place 2 sprays into both nostrils daily., Disp: 48 g, Rfl: 0   levocetirizine (XYZAL) 5 MG tablet, TAKE 1 TABLET (5 MG TOTAL) BY MOUTH EVERY EVENING., Disp: 90 tablet, Rfl: 0   olmesartan (BENICAR) 20 MG tablet, Take 1 tablet (20 mg total) by mouth daily., Disp: 90 tablet, Rfl: 0   valACYclovir (VALTREX) 500 MG tablet, Take 1 tablet (500 mg total) by mouth 3 (three) times daily., Disp: 30 tablet, Rfl: 2   atorvastatin (LIPITOR) 80 MG tablet, Take 1 tablet (80 mg total) by mouth daily., Disp: 90 tablet, Rfl: 1   empagliflozin (JARDIANCE) 25 MG TABS tablet, Take 1 tablet (25 mg total) by mouth daily., Disp: 90 tablet, Rfl: 0   insulin aspart (NOVOLOG FLEXPEN) 100  UNIT/ML FlexPen, Inject 3-15 Units into the skin 3 (three) times daily with meals., Disp: 45 mL, Rfl: 0   Insulin Degludec-Liraglutide (XULTOPHY) 100-3.6 UNIT-MG/ML SOPN, INJECT 50 UNITS INTO THE SKIN DAILY., Disp: 45 mL, Rfl: 0   omeprazole (PRILOSEC) 40 MG capsule, Take 1 capsule (40 mg total) by mouth daily., Disp: 90 capsule, Rfl: 0   tadalafil (CIALIS) 20 MG tablet, Take 1 tablet (20 mg total) by mouth daily as needed for erectile dysfunction., Disp: 30 tablet, Rfl: 0  No Known Allergies  I personally reviewed active problem list, medication list, allergies, family history, social history, health maintenance with the patient/caregiver today.   ROS  Constitutional: Negative for fever or weight change.  Respiratory: Negative for cough and shortness of breath.   Cardiovascular: Negative for chest pain or palpitations.  Gastrointestinal: Negative for abdominal pain, no bowel changes.  Musculoskeletal: Negative for gait problem or joint swelling.  Skin: Negative for rash.  Neurological: Negative for dizziness or headache.  No other specific complaints in a complete review of systems (except as listed in HPI above).   Objective  Vitals:   06/06/21 0757  BP: 134/84  Pulse: 89  Resp: 16  Temp: 98.4 F (36.9 C)  TempSrc: Oral  SpO2: 99%  Weight: 208 lb (94.3 kg)  Height: 5\' 11"  (1.803 m)    Body mass index is 29.01 kg/m.  Physical Exam  Constitutional: Patient appears well-developed and well-nourished. Overweight.  No distress.  HEENT: head atraumatic, normocephalic, pupils equal and reactive to light, neck supple Cardiovascular: Normal rate, regular rhythm and normal heart sounds.  No murmur heard. No BLE edema. Pulmonary/Chest: Effort normal and breath sounds normal. No respiratory distress. Abdominal: Soft.  There is no tenderness. Psychiatric: Patient has a normal mood and affect. behavior is normal. Judgment and thought content normal.   Recent Results (from the past  2160 hour(s))  POC Hemoccult Bld/Stl (3-Cd Home Screen)     Status: Normal   Collection Time: 03/25/21 11:35 AM  Result Value Ref Range   Card #1 Date [id number]    Fecal Occult Blood, POC Negative Negative   Card #2 Date [id number]    Card #2 Fecal Occult Blod, POC Negative    Card #3 Date [id number]    Card #3 Fecal Occult Blood, POC Negative   POCT HgB A1C     Status: Abnormal   Collection Time: 06/06/21  8:03 AM  Result Value Ref Range   Hemoglobin A1C 7.4 (A) 4.0 - 5.6 %   HbA1c POC (<> result, manual entry)  HbA1c, POC (prediabetic range)     HbA1c, POC (controlled diabetic range)      Diabetic Foot Exam: Diabetic Foot Exam - Simple   Simple Foot Form Diabetic Foot exam was performed with the following findings: Yes 06/06/2021  8:28 AM  Visual Inspection No deformities, no ulcerations, no other skin breakdown bilaterally: Yes Sensation Testing Intact to touch and monofilament testing bilaterally: Yes Pulse Check Posterior Tibialis and Dorsalis pulse intact bilaterally: Yes Comments      PHQ2/9: Depression screen [facility] 2/9 06/06/2021 02/20/2021 08/30/2020 03/20/2020 10/09/2019  Decreased Interest 0 0 0 2 0  Down, Depressed, Hopeless 0 0 0 0 0  PHQ - 2 Score 0 0 0 2 0  Altered sleeping - 0 - 0 0  Tired, decreased energy - 0 - 0 0  Change in appetite - 0 - 0 0  Feeling bad or failure about yourself  - 0 - 0 0  Trouble concentrating - 0 - 0 0  Moving slowly or fidgety/restless - 0 - 0 0  Suicidal thoughts - 0 - 0 0  PHQ-9 Score - 0 - 2 0  Difficult doing work/chores - Not difficult at all - Not difficult at all -  Some recent data might be hidden    phq 9 is negative   Fall Risk: Fall Risk  06/06/2021 02/20/2021 08/30/2020 03/20/2020 10/09/2019  Falls in the past year? 0 0 0 0 0  Number falls in past yr: 0 0 0 0 0  Injury with Fall? 0 0 0 0 0      Assessment & Plan  1. Diabetes mellitus type 2, with complication, on long term insulin pump (HCC)  - POCT HgB A1C - HM  Diabetes Foot Exam - empagliflozin (JARDIANCE) 25 MG TABS tablet; Take 1 tablet (25 mg total) by mouth daily.  Dispense: 90 tablet; Refill: 0 - tadalafil (CIALIS) 20 MG tablet; Take 1 tablet (20 mg total) by mouth daily as needed for erectile dysfunction.  Dispense: 30 tablet; Refill: 0 - insulin aspart (NOVOLOG FLEXPEN) 100 UNIT/ML FlexPen; Inject 3-15 Units into the skin 3 (three) times daily with meals.  Dispense: 45 mL; Refill: 0 - Continuous Blood Gluc Sensor (DEXCOM G6 SENSOR) MISC; 9 each by Does not apply route as directed. Every 10 days  Dispense: 9 each; Refill: 3 - Continuous Blood Gluc Transmit (DEXCOM G6 TRANSMITTER) MISC; 1 each by Does not apply route every 30 (thirty) days.  Dispense: 3 each; Refill: 3  2. Benign essential HTN   3. Type 2 diabetes mellitus with diabetic neuropathy, with long-term current use of insulin (HCC)  - Continuous Blood Gluc Sensor (DEXCOM G6 SENSOR) MISC; 9 each by Does not apply route as directed. Every 10 days  Dispense: 9 each; Refill: 3 - Continuous Blood Gluc Transmit (DEXCOM G6 TRANSMITTER) MISC; 1 each by Does not apply route every 30 (thirty) days.  Dispense: 3 each; Refill: 3  4. Gastroesophageal reflux disease without esophagitis  - omeprazole (PRILOSEC) 40 MG capsule; Take 1 capsule (40 mg total) by mouth daily.  Dispense: 90 capsule; Refill: 0  5. Hypertension complicating diabetes (HCC)  - olmesartan (BENICAR) 20 MG tablet; Take 1 tablet (20 mg total) by mouth daily.  Dispense: 90 tablet; Refill: 0  6. Dyslipidemia associated with type 2 diabetes mellitus (HCC)  - Continuous Blood Gluc Sensor (DEXCOM G6 SENSOR) MISC; 9 each by Does not apply route as directed. Every 10 days  Dispense: 9 each; Refill: 3 - Continuous Blood  Gluc Transmit (DEXCOM G6 TRANSMITTER) MISC; 1 each by Does not apply route every 30 (thirty) days.  Dispense: 3 each; Refill: 3  7. Need for shingles vaccine  - Varicella-zoster vaccine IM  8.  Hypercholesteremia  - atorvastatin (LIPITOR) 80 MG tablet; Take 1 tablet (80 mg total) by mouth daily.  Dispense: 90 tablet; Refill: 1  9. ED (erectile dysfunction) of organic origin  - tadalafil (CIALIS) 20 MG tablet; Take 1 tablet (20 mg total) by mouth daily as needed for erectile dysfunction.  Dispense: 30 tablet; Refill: 0  10. Type 2 diabetes mellitus with diabetic neuropathy, unspecified (HCC)  - Insulin Degludec-Liraglutide (XULTOPHY) 100-3.6 UNIT-MG/ML SOPN; INJECT 50 UNITS INTO THE SKIN DAILY.  Dispense: 45 mL; Refill: 0  11. Dyspepsia  - omeprazole (PRILOSEC) 40 MG capsule; Take 1 capsule (40 mg total) by mouth daily.  Dispense: 90 capsule; Refill: 0

## 2021-06-06 ENCOUNTER — Other Ambulatory Visit: Payer: Self-pay

## 2021-06-06 ENCOUNTER — Ambulatory Visit: Payer: 59 | Admitting: Family Medicine

## 2021-06-06 ENCOUNTER — Encounter: Payer: Self-pay | Admitting: Family Medicine

## 2021-06-06 VITALS — BP 134/84 | HR 89 | Temp 98.4°F | Resp 16 | Ht 71.0 in | Wt 208.0 lb

## 2021-06-06 DIAGNOSIS — K219 Gastro-esophageal reflux disease without esophagitis: Secondary | ICD-10-CM | POA: Diagnosis not present

## 2021-06-06 DIAGNOSIS — Z794 Long term (current) use of insulin: Secondary | ICD-10-CM

## 2021-06-06 DIAGNOSIS — Z9641 Presence of insulin pump (external) (internal): Secondary | ICD-10-CM

## 2021-06-06 DIAGNOSIS — E1140 Type 2 diabetes mellitus with diabetic neuropathy, unspecified: Secondary | ICD-10-CM | POA: Diagnosis not present

## 2021-06-06 DIAGNOSIS — E7800 Pure hypercholesterolemia, unspecified: Secondary | ICD-10-CM

## 2021-06-06 DIAGNOSIS — I152 Hypertension secondary to endocrine disorders: Secondary | ICD-10-CM

## 2021-06-06 DIAGNOSIS — I10 Essential (primary) hypertension: Secondary | ICD-10-CM

## 2021-06-06 DIAGNOSIS — E785 Hyperlipidemia, unspecified: Secondary | ICD-10-CM

## 2021-06-06 DIAGNOSIS — Z23 Encounter for immunization: Secondary | ICD-10-CM | POA: Diagnosis not present

## 2021-06-06 DIAGNOSIS — E118 Type 2 diabetes mellitus with unspecified complications: Secondary | ICD-10-CM

## 2021-06-06 DIAGNOSIS — N529 Male erectile dysfunction, unspecified: Secondary | ICD-10-CM

## 2021-06-06 DIAGNOSIS — E1159 Type 2 diabetes mellitus with other circulatory complications: Secondary | ICD-10-CM

## 2021-06-06 DIAGNOSIS — R1013 Epigastric pain: Secondary | ICD-10-CM

## 2021-06-06 DIAGNOSIS — E1169 Type 2 diabetes mellitus with other specified complication: Secondary | ICD-10-CM

## 2021-06-06 LAB — POCT GLYCOSYLATED HEMOGLOBIN (HGB A1C): Hemoglobin A1C: 7.4 % — AB (ref 4.0–5.6)

## 2021-06-06 MED ORDER — DEXCOM G6 TRANSMITTER MISC: 1.0000 | 3 refills | Status: DC

## 2021-06-06 MED ORDER — DEXCOM G6 SENSOR MISC: 9.0000 | 3 refills | Status: DC

## 2021-06-06 MED ORDER — OMEPRAZOLE 40 MG PO CPDR: 40.0000 mg | DELAYED_RELEASE_CAPSULE | Freq: Every day | ORAL | 0 refills | Status: DC

## 2021-06-06 MED ORDER — ATORVASTATIN CALCIUM 80 MG PO TABS: 80.0000 mg | ORAL_TABLET | Freq: Every day | ORAL | 1 refills | Status: DC

## 2021-06-06 MED ORDER — NOVOLOG FLEXPEN 100 UNIT/ML ~~LOC~~ SOPN: 3.0000 [IU] | PEN_INJECTOR | Freq: Three times a day (TID) | SUBCUTANEOUS | 0 refills | Status: DC

## 2021-06-06 MED ORDER — TADALAFIL 20 MG PO TABS: 20.0000 mg | ORAL_TABLET | Freq: Every day | ORAL | 0 refills | Status: DC | PRN

## 2021-06-06 MED ORDER — OLMESARTAN MEDOXOMIL 20 MG PO TABS: 20.0000 mg | ORAL_TABLET | Freq: Every day | ORAL | 0 refills | Status: DC

## 2021-06-06 MED ORDER — XULTOPHY 100-3.6 UNIT-MG/ML ~~LOC~~ SOPN: PEN_INJECTOR | SUBCUTANEOUS | 0 refills | Status: DC

## 2021-06-06 MED ORDER — GVOKE HYPOPEN 1-PACK 1 MG/0.2ML ~~LOC~~ SOAJ: 1.0000 | Freq: Every day | SUBCUTANEOUS | 1 refills | Status: AC | PRN

## 2021-06-06 MED ORDER — EMPAGLIFLOZIN 25 MG PO TABS: 25.0000 mg | ORAL_TABLET | Freq: Every day | ORAL | 0 refills | Status: DC

## 2021-07-31 ENCOUNTER — Encounter: Payer: Self-pay | Admitting: Family Medicine

## 2021-08-08 ENCOUNTER — Other Ambulatory Visit: Payer: Self-pay | Admitting: Family Medicine

## 2021-08-25 ENCOUNTER — Other Ambulatory Visit: Payer: Self-pay | Admitting: Family Medicine

## 2021-08-25 MED ORDER — AMLODIPINE BESY-BENAZEPRIL HCL 5-40 MG PO CAPS: 1.0000 | ORAL_CAPSULE | Freq: Every day | ORAL | 0 refills | Status: DC

## 2021-09-02 ENCOUNTER — Other Ambulatory Visit: Payer: Self-pay | Admitting: Family Medicine

## 2021-09-02 DIAGNOSIS — E118 Type 2 diabetes mellitus with unspecified complications: Secondary | ICD-10-CM

## 2021-09-02 DIAGNOSIS — Z9641 Presence of insulin pump (external) (internal): Secondary | ICD-10-CM

## 2021-09-02 NOTE — Progress Notes (Signed)
Name: Joseph Johnson   MRN: [id number]    DOB: [birth year]   Date:09/03/2021       Progress Note  Subjective  Chief Complaint  Follow Up  HPI  DMII: He saw Dr. Tedd Sias in June of 2019 but because of cost of seeing sub-specialist  and A1C has improved he is back here for DM management.  A1C was 6.8% ,7.4%, 6.8% and it went up to 8 % Spring 2021 it went up to 8.8 % down to  7.9 % 7.5 % and today he will need to go to the lab. He denies polyphagia, polydipsia or polyuria, he is using Dexcom and average glucose has been 125 over the past 90 days, only a few episode of hypoglycemia and around 58, he states his phone beeped  He has ED and takes Cialis, he is on statin therapy, he was on Lotrel  bp is at goal today.   HTN: he  has been compliant with medication, back on Lotrel. No chest pain , dizziness or palpitation   Hyperlipidemia: lipid panel was at goal LDL was 70 , he states compliant with medication now, no myalgia  ED: he states Cialis prn and it works well for him, I will send a refill to [facility]   GERD: taking Omeprazole and states no heartburn or indigestion  Herpes Genitalis: he started to have outbreaks in his 20's, he states last episode was over two years ago, he had an episodes April 2021 but nothing since, he does not take suppressive medication since married and episodes are infrequent Stable   Patient Active Problem List   Diagnosis Date Noted   Type 2 diabetes mellitus with hyperglycemia, with long-term current use of insulin (HCC) 03/11/2018   Allergic rhinitis, seasonal 03/03/2016   ED (erectile dysfunction) 06/21/2015   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  Family History  Problem Relation Age of Onset   Hypertension Mother    Diabetes Father    Diabetes Brother     Hypercalcemia Brother    Hypertension Brother     Social History   Tobacco Use   Smoking status: Former    Types: Cigarettes    Quit date: 09/19/1990    Years since quitting: 30.9   Smokeless tobacco: Never   Tobacco comments:    more than 20 years ago  Substance Use Topics   Alcohol use: No    Alcohol/week: 0.0 standard drinks     Current Outpatient Medications:    ACCU-CHEK GUIDE test strip, 1 EACH BY OTHER ROUTE 4 (FOUR) TIMES DAILY - BEFORE MEALS AND AT BEDTIME. USE AS INSTRUCTED, Disp: 400 strip, Rfl: 3   aspirin 81 MG tablet, , Disp: , Rfl:    atorvastatin (LIPITOR) 80 MG tablet, Take 1 tablet (80 mg total) by mouth daily., Disp: 90 tablet, Rfl: 1   BD PEN NEEDLE MICRO U/F 32G X 6 MM MISC, USE AS DIRECTED, Disp: 100 each, Rfl: 1   Continuous Blood Gluc Sensor (DEXCOM G6 SENSOR) MISC, 9 each by Does not apply route as directed. Every 10 days, Disp: 9 each, Rfl: 3   Continuous Blood Gluc Transmit (DEXCOM G6 TRANSMITTER) MISC, 1 each by Does not apply route every 30 (thirty) days., Disp: 3 each, Rfl: 3   fluticasone (FLONASE) 50 MCG/ACT nasal spray, Place 2 sprays  into both nostrils daily., Disp: 48 g, Rfl: 0   Glucagon (GVOKE HYPOPEN 1-PACK) 1 MG/0.2ML SOAJ, Inject 1 each into the skin daily as needed. Severe hypoglycemia, Disp: 0.2 mL, Rfl: 1   levocetirizine (XYZAL) 5 MG tablet, TAKE 1 TABLET (5 MG TOTAL) BY MOUTH EVERY EVENING., Disp: 90 tablet, Rfl: 0   omeprazole (PRILOSEC) 40 MG capsule, Take 1 capsule (40 mg total) by mouth daily., Disp: 90 capsule, Rfl: 0   valACYclovir (VALTREX) 500 MG tablet, Take 1 tablet (500 mg total) by mouth 3 (three) times daily., Disp: 30 tablet, Rfl: 2   amLODipine-benazepril (LOTREL) 5-40 MG capsule, Take 1 capsule by mouth daily., Disp: 90 capsule, Rfl: 1   empagliflozin (JARDIANCE) 25 MG TABS tablet, Take 1 tablet (25 mg total) by mouth daily., Disp: 90 tablet, Rfl: 1   insulin aspart (NOVOLOG FLEXPEN) 100 UNIT/ML FlexPen, Inject 3-15 Units  into the skin 3 (three) times daily with meals., Disp: 45 mL, Rfl: 1   Insulin Degludec-Liraglutide (XULTOPHY) 100-3.6 UNIT-MG/ML SOPN, INJECT 50 UNITS INTO THE SKIN DAILY., Disp: 45 mL, Rfl: 1   tadalafil (CIALIS) 20 MG tablet, Take 1 tablet (20 mg total) by mouth daily as needed for erectile dysfunction., Disp: 30 tablet, Rfl: 0  No Known Allergies  I personally reviewed active problem list, medication list, allergies, family history, social history, health maintenance with the patient/caregiver today.   ROS  Constitutional: Negative for fever or weight change.  Respiratory: Negative for cough and shortness of breath.   Cardiovascular: Negative for chest pain or palpitations.  Gastrointestinal: Negative for abdominal pain, no bowel changes.  Musculoskeletal: Negative for gait problem or joint swelling.  Skin: Negative for rash.  Neurological: Negative for dizziness or headache.  No other specific complaints in a complete review of systems (except as listed in HPI above).   Objective  Vitals:   09/03/21 0731  BP: 136/80  Pulse: 82  Resp: 16  Temp: 98.2 F (36.8 C)  SpO2: 97%  Weight: 213 lb (96.6 kg)  Height: 5\' 11"  (1.803 m)    Body mass index is 29.71 kg/m.  Physical Exam  Constitutional: Patient appears well-developed and well-nourished. Overweight.  No distress.  HEENT: head atraumatic, normocephalic, pupils equal and reactive to light, neck supple, Cardiovascular: Normal rate, regular rhythm and normal heart sounds.  No murmur heard. No BLE edema. Pulmonary/Chest: Effort normal and breath sounds normal. No respiratory distress. Abdominal: Soft.  There is no tenderness. Psychiatric: Patient has a normal mood and affect. behavior is normal. Judgment and thought content normal.    Recent Results (from the past 2160 hour(s))  POCT HgB A1C     Status: Abnormal   Collection Time: 06/06/21  8:03 AM  Result Value Ref Range   Hemoglobin A1C 7.4 (A) 4.0 - 5.6 %   HbA1c  POC (<> result, manual entry)     HbA1c, POC (prediabetic range)     HbA1c, POC (controlled diabetic range)       PHQ2/9: Depression screen [facility] 2/9 09/03/2021 06/06/2021 02/20/2021 08/30/2020 03/20/2020  Decreased Interest 0 0 0 0 2  Down, Depressed, Hopeless 0 0 0 0 0  PHQ - 2 Score 0 0 0 0 2  Altered sleeping - - 0 - 0  Tired, decreased energy - - 0 - 0  Change in appetite - - 0 - 0  Feeling bad or failure about yourself  - - 0 - 0  Trouble concentrating - - 0 - 0  Moving slowly  or fidgety/restless - - 0 - 0  Suicidal thoughts - - 0 - 0  PHQ-9 Score - - 0 - 2  Difficult doing work/chores - - Not difficult at all - Not difficult at all  Some recent data might be hidden    phq 9 is negative   Fall Risk: Fall Risk  09/03/2021 06/06/2021 02/20/2021 08/30/2020 03/20/2020  Falls in the past year? 0 0 0 0 0  Number falls in past yr: 0 0 0 0 0  Injury with Fall? 0 0 0 0 0  Risk for fall due to : No Fall Risks - - - -  Follow up Falls prevention discussed - - - -      Functional Status Survey: Is the patient deaf or have difficulty hearing?: No Does the patient have difficulty seeing, even when wearing glasses/contacts?: No Does the patient have difficulty concentrating, remembering, or making decisions?: No Does the patient have difficulty walking or climbing stairs?: No Does the patient have difficulty dressing or bathing?: No Does the patient have difficulty doing errands alone such as visiting a doctor's office or shopping?: No    Assessment & Plan  1. Diabetes mellitus type 2, with complication, on long term insulin pump (HCC)  - HgB A1c - empagliflozin (JARDIANCE) 25 MG TABS tablet; Take 1 tablet (25 mg total) by mouth daily.  Dispense: 90 tablet; Refill: 1 - insulin aspart (NOVOLOG FLEXPEN) 100 UNIT/ML FlexPen; Inject 3-15 Units into the skin 3 (three) times daily with meals.  Dispense: 45 mL; Refill: 1 - tadalafil (CIALIS) 20 MG tablet; Take 1 tablet (20 mg total) by mouth  daily as needed for erectile dysfunction.  Dispense: 30 tablet; Refill: 0  2. Need for shingles vaccine  - Varicella-zoster vaccine IM (Shingrix)  3. Need for immunization against influenza  - Flu Vaccine QUAD 14mo+IM (Fluarix, Fluzone & Alfiuria Quad PF)  4. Benign essential HTN  - Insulin Degludec-Liraglutide (XULTOPHY) 100-3.6 UNIT-MG/ML SOPN; INJECT 50 UNITS INTO THE SKIN DAILY.  Dispense: 45 mL; Refill: 1  5. ED (erectile dysfunction) of organic origin  - tadalafil (CIALIS) 20 MG tablet; Take 1 tablet (20 mg total) by mouth daily as needed for erectile dysfunction.  Dispense: 30 tablet; Refill: 0  6. Gastroesophageal reflux disease without esophagitis

## 2021-09-03 ENCOUNTER — Encounter: Payer: Self-pay | Admitting: Family Medicine

## 2021-09-03 ENCOUNTER — Ambulatory Visit: Payer: 59 | Admitting: Family Medicine

## 2021-09-03 ENCOUNTER — Other Ambulatory Visit: Payer: Self-pay

## 2021-09-03 ENCOUNTER — Other Ambulatory Visit: Payer: Self-pay | Admitting: Family Medicine

## 2021-09-03 VITALS — BP 136/80 | HR 82 | Temp 98.2°F | Resp 16 | Ht 71.0 in | Wt 213.0 lb

## 2021-09-03 DIAGNOSIS — N529 Male erectile dysfunction, unspecified: Secondary | ICD-10-CM

## 2021-09-03 DIAGNOSIS — Z9641 Presence of insulin pump (external) (internal): Secondary | ICD-10-CM

## 2021-09-03 DIAGNOSIS — E118 Type 2 diabetes mellitus with unspecified complications: Secondary | ICD-10-CM | POA: Diagnosis not present

## 2021-09-03 DIAGNOSIS — E1159 Type 2 diabetes mellitus with other circulatory complications: Secondary | ICD-10-CM

## 2021-09-03 DIAGNOSIS — K219 Gastro-esophageal reflux disease without esophagitis: Secondary | ICD-10-CM

## 2021-09-03 DIAGNOSIS — Z23 Encounter for immunization: Secondary | ICD-10-CM

## 2021-09-03 DIAGNOSIS — I10 Essential (primary) hypertension: Secondary | ICD-10-CM

## 2021-09-03 DIAGNOSIS — I152 Hypertension secondary to endocrine disorders: Secondary | ICD-10-CM

## 2021-09-03 MED ORDER — NOVOLOG FLEXPEN 100 UNIT/ML ~~LOC~~ SOPN: 3.0000 [IU] | PEN_INJECTOR | Freq: Three times a day (TID) | SUBCUTANEOUS | 1 refills | Status: DC

## 2021-09-03 MED ORDER — EMPAGLIFLOZIN 25 MG PO TABS: 25.0000 mg | ORAL_TABLET | Freq: Every day | ORAL | 1 refills | Status: DC

## 2021-09-03 MED ORDER — XULTOPHY 100-3.6 UNIT-MG/ML ~~LOC~~ SOPN: PEN_INJECTOR | SUBCUTANEOUS | 1 refills | Status: DC

## 2021-09-03 MED ORDER — TADALAFIL 20 MG PO TABS: 20.0000 mg | ORAL_TABLET | Freq: Every day | ORAL | 0 refills | Status: DC | PRN

## 2021-09-03 MED ORDER — AMLODIPINE BESY-BENAZEPRIL HCL 5-40 MG PO CAPS: 1.0000 | ORAL_CAPSULE | Freq: Every day | ORAL | 1 refills | Status: DC

## 2021-09-03 NOTE — Telephone Encounter (Signed)
Requested medications are due for refill today.  [place]  Requested medications are on the active medications list.  no  Last refill. 06/06/2021  Future visit scheduled.   yes  Notes to clinic.  This medication was discontinued on 08/08/2021.

## 2021-09-03 NOTE — Telephone Encounter (Signed)
Pt has an appt today.  

## 2021-09-04 LAB — HEMOGLOBIN A1C
Hgb A1c MFr Bld: 6 % of total Hgb — ABNORMAL HIGH (ref <5.7)
Mean Plasma Glucose: 126 mg/dL
eAG (mmol/L): 7 mmol/L

## 2021-11-12 ENCOUNTER — Encounter: Payer: Self-pay | Admitting: Family Medicine

## 2021-11-13 ENCOUNTER — Other Ambulatory Visit: Payer: Self-pay | Admitting: Family Medicine

## 2021-11-13 DIAGNOSIS — K219 Gastro-esophageal reflux disease without esophagitis: Secondary | ICD-10-CM

## 2021-11-13 NOTE — Telephone Encounter (Signed)
Requested Prescriptions  Pending Prescriptions Disp Refills  . omeprazole (PRILOSEC) 40 MG capsule [Pharmacy Med Name: OMEPRAZOLE DR 40 MG CAPSULE] 90 capsule 0    Sig: TAKE 1 CAPSULE (40 MG TOTAL) BY MOUTH DAILY.     Gastroenterology: Proton Pump Inhibitors Passed - 11/13/2021  3:57 AM      Passed - Valid encounter within last 12 months    Recent Outpatient Visits          2 months ago Diabetes mellitus type 2, with complication, on long term insulin pump [facility])   [facility], Danna Hefty, MD   5 months ago Diabetes mellitus type 2, with complication, on long term insulin pump [facility])   [facility] [place], Danna Hefty, MD   8 months ago Type II diabetes mellitus with neuropathy causing erectile dysfunction [facility])   [facility] Alba Cory, MD   1 year ago Diabetes mellitus type 2, insulin dependent [facility])   [facility] Alba Cory, MD   1 year ago No-show for appointment   [facility] Alba Cory, MD      Future Appointments            In 3 weeks Alba Cory, MD [facility], [facility]

## 2021-11-13 NOTE — Telephone Encounter (Signed)
Since it is December I would recommend he get a 30-day supply of Xultophy to last him until 2023 and then he can use a new coupon card to get Xultophy at the reduced rate. Otherwise, depending on his income he can apply for patient assistance for Xultophy at: [email or web address]  Cheyenne Adas, CPP Clinical Pharmacist Practitioner  [facility] [phone]

## 2021-11-14 ENCOUNTER — Telehealth: Payer: Self-pay | Admitting: Family Medicine

## 2021-11-14 ENCOUNTER — Other Ambulatory Visit: Payer: Self-pay | Admitting: Family Medicine

## 2021-11-14 MED ORDER — SOLIQUA 100-33 UNT-MCG/ML ~~LOC~~ SOPN: 50.0000 [IU] | PEN_INJECTOR | Freq: Every day | SUBCUTANEOUS | 1 refills | Status: DC

## 2021-11-14 NOTE — Telephone Encounter (Signed)
Pt is calling to report even with the coupon the xultophy is $1200. Pt is requesting Dr. Carlynn Purl to change the medication. CB= [phone]  Pt is needing some medication right away.

## 2021-11-14 NOTE — Telephone Encounter (Signed)
Pt stated the voucher did not work, pt wanted a call back to see if something else can be sent in, please advise.

## 2021-12-04 NOTE — Progress Notes (Signed)
Name: Joseph Johnson   MRN: [id number]    DOB: 05/07/1961   Date:12/05/2021       Progress Note  Subjective  Chief Complaint  Follow Up  HPI  DMII: He saw Dr. Tedd Sias in June of 2019 but because of cost of seeing sub-specialist  and A1C has improved he is back here for DM management.  A1C was 6.8% ,7.4%, 6.8% and it went up to 8 % Spring 2021 it went up to 8.8 % , 7.9 % 7.5 %, 6 % and switched from Xultophy to [place] and today A1C is 6.6 %. He states Lawana Chambers seems not to control his glucose as well, it has been staying higher more often with [place] - started about one month and on clarity app it has only been in range about 50 % of the time.  He denies polyphagia, polydipsia or polyuria. He is on statin therapy, ACE for kidney and also takes Cialis for ED. He developed a rash on site of last sensor placement.   HTN: he  has been compliant with medication, back on Lotrel. No chest pain , dizziness or palpitation  BP is at goal   Hyperlipidemia: lipid panel was at goal LDL was 70 , he has been compliant with medications   ED: he states Cialis prn and it works well for him, he needs a refill today   GERD: taking Omeprazole and states no heartburn or indigestion  Herpes Genitalis: he started to have outbreaks in his 20's, he states last episode was over two years ago, he had an episodes April 2021 but nothing since, he does not take suppressive medication since married and episodes are infrequent . 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  Family History  Problem Relation Age of Onset   Hypertension Mother    Diabetes Father    Diabetes Brother    Hypercalcemia Brother    Hypertension Brother     Social History   Tobacco Use   Smoking status: Former    Types: Cigarettes    Quit date: 09/19/1990    Years since quitting: 31.2   Smokeless tobacco: Never   Tobacco comments:    more than 20 years ago  Substance Use Topics   Alcohol use: No    Alcohol/week: 0.0 standard drinks     Current Outpatient Medications:    ACCU-CHEK GUIDE test strip, 1 EACH BY OTHER ROUTE 4 (FOUR) TIMES DAILY - BEFORE MEALS AND AT BEDTIME. USE AS INSTRUCTED, Disp: 400 strip, Rfl: 3   amLODipine-benazepril (LOTREL) 5-40 MG capsule, Take 1 capsule by mouth daily., Disp: 90 capsule, Rfl: 1   aspirin 81 MG tablet, , Disp: , Rfl:    atorvastatin (LIPITOR) 80 MG tablet, Take 1 tablet (80 mg total) by mouth daily., Disp: 90 tablet, Rfl: 1   BD PEN NEEDLE MICRO U/F 32G X 6 MM MISC, USE AS DIRECTED, Disp: 100 each, Rfl: 1   Continuous Blood Gluc Sensor (DEXCOM G6 SENSOR) MISC, 9 each by Does not apply route as directed. Every  10 days, Disp: 9 each, Rfl: 3   Continuous Blood Gluc Transmit (DEXCOM G6 TRANSMITTER) MISC, 1 each by Does not apply route every 30 (thirty) days., Disp: 3 each, Rfl: 3   empagliflozin (JARDIANCE) 25 MG TABS tablet, Take 1 tablet (25 mg total) by mouth daily., Disp: 90 tablet, Rfl: 1   fluticasone (FLONASE) 50 MCG/ACT nasal spray, Place 2 sprays into both nostrils daily., Disp: 48 g, Rfl: 0   Glucagon (GVOKE HYPOPEN 1-PACK) 1 MG/0.2ML SOAJ, Inject 1 each into the skin daily as needed. Severe hypoglycemia, Disp: 0.2 mL, Rfl: 1   insulin aspart (NOVOLOG FLEXPEN) 100 UNIT/ML FlexPen, Inject 3-15 Units into the skin 3 (three) times daily with meals., Disp: 45 mL, Rfl: 1   Insulin Glargine-Lixisenatide (SOLIQUA) 100-33 UNT-MCG/ML SOPN, Inject 50 Units into the skin daily at 12 noon., Disp: 15 mL, Rfl: 1   levocetirizine  (XYZAL) 5 MG tablet, TAKE 1 TABLET (5 MG TOTAL) BY MOUTH EVERY EVENING., Disp: 90 tablet, Rfl: 0   omeprazole (PRILOSEC) 40 MG capsule, TAKE 1 CAPSULE (40 MG TOTAL) BY MOUTH DAILY., Disp: 90 capsule, Rfl: 1   tadalafil (CIALIS) 20 MG tablet, Take 1 tablet (20 mg total) by mouth daily as needed for erectile dysfunction., Disp: 30 tablet, Rfl: 0   valACYclovir (VALTREX) 500 MG tablet, Take 1 tablet (500 mg total) by mouth 3 (three) times daily., Disp: 30 tablet, Rfl: 2  No Known Allergies  I personally reviewed active problem list, medication list, allergies, family history, social history, health maintenance with the patient/caregiver today.   ROS  Constitutional: Negative for fever or weight change.  Respiratory: Negative for cough and shortness of breath.   Cardiovascular: Negative for chest pain or palpitations.  Gastrointestinal: Negative for abdominal pain, no bowel changes.  Musculoskeletal: Negative for gait problem or joint swelling.  Skin: Negative for rash.  Neurological: Negative for dizziness or headache.  No other specific complaints in a complete review of systems (except as listed in HPI above).   Objective  Vitals:   12/05/21 0804  BP: 136/70  Pulse: 86  Resp: 16  Temp: 98.3 F (36.8 C)  SpO2: 97%  Weight: 216 lb (98 kg)  Height: 5\' 11"  (1.803 m)    Body mass index is 30.13 kg/m.  Physical Exam  Constitutional: Patient appears well-developed and well-nourished. Obese  No distress.  HEENT: head atraumatic, normocephalic, pupils equal and reactive to light,neck supple Cardiovascular: Normal rate, regular rhythm and normal heart sounds.  No murmur heard. No BLE edema. Pulmonary/Chest: Effort normal and breath sounds normal. No respiratory distress. Abdominal: Soft.  There is no tenderness. Psychiatric: Patient has a normal mood and affect. behavior is normal. Judgment and thought content normal.    PHQ2/9: Depression screen [facility] 2/9 12/05/2021 09/03/2021  06/06/2021 02/20/2021 08/30/2020  Decreased Interest 0 0 0 0 0  Down, Depressed, Hopeless 0 0 0 0 0  PHQ - 2 Score 0 0 0 0 0  Altered sleeping 0 - - 0 -  Tired, decreased energy 0 - - 0 -  Change in appetite 0 - - 0 -  Feeling bad or failure about yourself  0 - - 0 -  Trouble concentrating 0 - - 0 -  Moving slowly or fidgety/restless 0 - - 0 -  Suicidal thoughts 0 - - 0 -  PHQ-9 Score 0 - - 0 -  Difficult doing work/chores - - - Not difficult at all -  Some recent data might be hidden  phq 9 is negative   Fall Risk: Fall Risk  12/05/2021 09/03/2021 06/06/2021 02/20/2021 08/30/2020  Falls in the past year? 0 0 0 0 0  Number falls in past yr: 0 0 0 0 0  Injury with Fall? 0 0 0 0 0  Risk for fall due to : No Fall Risks No Fall Risks - - -  Follow up Falls prevention discussed Falls prevention discussed - - -      Functional Status Survey: Is the patient deaf or have difficulty hearing?: No Does the patient have difficulty seeing, even when wearing glasses/contacts?: No Does the patient have difficulty concentrating, remembering, or making decisions?: No Does the patient have difficulty walking or climbing stairs?: No Does the patient have difficulty dressing or bathing?: No Does the patient have difficulty doing errands alone such as visiting a doctor's office or shopping?: No    Assessment & Plan  1. Dyslipidemia associated with type 2 diabetes mellitus (HCC)  - POCT HgB A1C - Insulin Glargine-Lixisenatide (SOLIQUA) 100-33 UNT-MCG/ML SOPN; Inject 60 Units into the skin daily at 12 noon.  Dispense: 60 mL; Refill: 1  2. Benign essential HTN   3. Gastroesophageal reflux disease without esophagitis   4. Hypertension complicating diabetes (HCC)   5. Rash  Advised topical antibiotic ointment   6. ED (erectile dysfunction) of organic origin  - tadalafil (CIALIS) 20 MG tablet; Take 1 tablet (20 mg total) by mouth daily as needed for erectile dysfunction.  Dispense: 30  tablet; Refill: 0  7. Hypercholesteremia  - atorvastatin (LIPITOR) 80 MG tablet; Take 1 tablet (80 mg total) by mouth daily.  Dispense: 90 tablet; Refill: 1  8. 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  Dispense: 60 mL; Refill: 1

## 2021-12-05 ENCOUNTER — Encounter: Payer: Self-pay | Admitting: Family Medicine

## 2021-12-05 ENCOUNTER — Other Ambulatory Visit: Payer: Self-pay

## 2021-12-05 ENCOUNTER — Ambulatory Visit: Payer: 59 | Admitting: Family Medicine

## 2021-12-05 VITALS — BP 136/70 | HR 86 | Temp 98.3°F | Resp 16 | Ht 71.0 in | Wt 216.0 lb

## 2021-12-05 DIAGNOSIS — N529 Male erectile dysfunction, unspecified: Secondary | ICD-10-CM

## 2021-12-05 DIAGNOSIS — E1159 Type 2 diabetes mellitus with other circulatory complications: Secondary | ICD-10-CM | POA: Diagnosis not present

## 2021-12-05 DIAGNOSIS — E7800 Pure hypercholesterolemia, unspecified: Secondary | ICD-10-CM

## 2021-12-05 DIAGNOSIS — I10 Essential (primary) hypertension: Secondary | ICD-10-CM | POA: Diagnosis not present

## 2021-12-05 DIAGNOSIS — E1140 Type 2 diabetes mellitus with diabetic neuropathy, unspecified: Secondary | ICD-10-CM

## 2021-12-05 DIAGNOSIS — E1169 Type 2 diabetes mellitus with other specified complication: Secondary | ICD-10-CM | POA: Diagnosis not present

## 2021-12-05 DIAGNOSIS — I152 Hypertension secondary to endocrine disorders: Secondary | ICD-10-CM

## 2021-12-05 DIAGNOSIS — K219 Gastro-esophageal reflux disease without esophagitis: Secondary | ICD-10-CM | POA: Diagnosis not present

## 2021-12-05 DIAGNOSIS — R21 Rash and other nonspecific skin eruption: Secondary | ICD-10-CM

## 2021-12-05 DIAGNOSIS — E785 Hyperlipidemia, unspecified: Secondary | ICD-10-CM | POA: Diagnosis not present

## 2021-12-05 DIAGNOSIS — N521 Erectile dysfunction due to diseases classified elsewhere: Secondary | ICD-10-CM

## 2021-12-05 LAB — POCT GLYCOSYLATED HEMOGLOBIN (HGB A1C): Hemoglobin A1C: 6.6 % — AB (ref 4.0–5.6)

## 2021-12-05 MED ORDER — TADALAFIL 20 MG PO TABS: 20.0000 mg | ORAL_TABLET | Freq: Every day | ORAL | 0 refills | Status: DC | PRN

## 2021-12-05 MED ORDER — SOLIQUA 100-33 UNT-MCG/ML ~~LOC~~ SOPN: 60.0000 [IU] | PEN_INJECTOR | Freq: Every day | SUBCUTANEOUS | 1 refills | Status: DC

## 2021-12-05 MED ORDER — ATORVASTATIN CALCIUM 80 MG PO TABS: 80.0000 mg | ORAL_TABLET | Freq: Every day | ORAL | 1 refills | Status: DC

## 2022-03-01 ENCOUNTER — Other Ambulatory Visit: Payer: Self-pay | Admitting: Family Medicine

## 2022-03-01 DIAGNOSIS — E118 Type 2 diabetes mellitus with unspecified complications: Secondary | ICD-10-CM

## 2022-03-03 NOTE — Progress Notes (Signed)
Name: Joseph Johnson   MRN: [id number]    DOB: 1961/03/01   Date:03/04/2022 ? ?     Progress Note ? ?Subjective ? ?Chief Complaint ? ?Follow Up ? ?HPI ? ?DMII: He saw Dr. Tedd Sias in June of 2019 but because of cost of seeing sub-specialist  and A1C has improved he is back here for DM management.  A1C was 6.8% ,7.4%, 6.8% and it went up to 8 % Spring 2021 it went up again to 8.8 % , 7.9 % 7.5 %, 6 % and switched from Xultophy to [place] and last A1C was  6.6 %.  He denies polyphagia, polydipsia or polyuria. He is on statin therapy, ACE for kidney and also takes Cialis for ED. He started developed a rash to the adhesive of Dexcom sensor about 4 months ago, he finally stopped using the sensor about one month ago.   ? ?HTN: he  has been compliant with medication, back on Lotrel. No chest pain , dizziness or palpitation  BP is at goal . We will send refill to pharmacy  ? ?Hyperlipidemia: lipid panel was at goal LDL was 70 , he has been compliant with medications We will recheck labs today  ? ?ED: he states Cialis prn and it works well for him, he needs a refill today  ? ?GERD: taking Omeprazole and states no heartburn or indigestion as long as he takes medication  ? ?Herpes Genitalis: he started to have outbreaks in his 20's, he states last episode was over two years ago, he had an episodes April 2021 but nothing since, he does not take suppressive medication since married and episodes are infrequent . Unchanged  ? ?Patient Active Problem List  ? Diagnosis Date Noted  ? Type 2 diabetes mellitus with hyperglycemia, with long-term current use of insulin (HCC) 03/11/2018  ? Allergic rhinitis, seasonal 03/03/2016  ? ED (erectile dysfunction) 06/21/2015  ? 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 Hypercholesteremia 06/20/2015  ? Benign essential HTN 02/02/2008  ? ? ?No past surgical history on file. ? ?Family History  ?Problem Relation Age of Onset  ? Hypertension Mother   ?  Diabetes Father   ? Diabetes Brother   ? Hypercalcemia Brother   ? Hypertension Brother   ? ? ?Social History  ? ?Tobacco Use  ? Smoking status: Former  ?  Types: Cigarettes  ?  Quit date: 09/19/1990  ?  Years since quitting: 31.4  ? Smokeless tobacco: Never  ? Tobacco comments:  ?  more than 20 years ago  ?Substance Use Topics  ? Alcohol use: No  ?  Alcohol/week: 0.0 standard drinks  ? ? ? ?Current Outpatient Medications:  ?  ACCU-CHEK GUIDE test strip, 1 EACH BY OTHER ROUTE 4 (FOUR) TIMES DAILY - BEFORE MEALS AND AT BEDTIME. USE AS INSTRUCTED, Disp: 400 strip, Rfl: 3 ?  amLODipine-benazepril (LOTREL) 5-40 MG capsule, Take 1 capsule by mouth daily., Disp: 90 capsule, Rfl: 1 ?  aspirin 81 MG tablet, , Disp: , Rfl:  ?  atorvastatin (LIPITOR) 80 MG tablet, Take 1 tablet (80 mg total) by mouth daily., Disp: 90 tablet, Rfl: 1 ?  BD PEN NEEDLE MICRO U/F 32G X 6 MM MISC, USE AS DIRECTED, Disp: 100 each, Rfl: 1 ?  Continuous Blood Gluc Sensor (DEXCOM G6 SENSOR) MISC, 9 each by Does not apply route as directed. Every 10 days, Disp: 9 each, Rfl: 3 ?  Continuous Blood Gluc Transmit (DEXCOM G6 TRANSMITTER)  MISC, 1 each by Does not apply route every 30 (thirty) days., Disp: 3 each, Rfl: 3 ?  empagliflozin (JARDIANCE) 25 MG TABS tablet, Take 1 tablet (25 mg total) by mouth daily., Disp: 90 tablet, Rfl: 1 ?  fluticasone (FLONASE) 50 MCG/ACT nasal spray, Place 2 sprays into both nostrils daily., Disp: 48 g, Rfl: 0 ?  Glucagon (GVOKE HYPOPEN 1-PACK) 1 MG/0.2ML SOAJ, Inject 1 each into the skin daily as needed. Severe hypoglycemia, Disp: 0.2 mL, Rfl: 1 ?  Insulin Glargine-Lixisenatide (SOLIQUA) 100-33 UNT-MCG/ML SOPN, Inject 60 Units into the skin daily at 12 noon., Disp: 60 mL, Rfl: 1 ?  levocetirizine (XYZAL) 5 MG tablet, TAKE 1 TABLET (5 MG TOTAL) BY MOUTH EVERY EVENING., Disp: 90 tablet, Rfl: 0 ?  NOVOLOG FLEXPEN 100 UNIT/ML FlexPen, INJECT 3-15 UNITS INTO THE SKIN 3 (THREE) TIMES DAILY WITH MEALS., Disp: 15 mL, Rfl: 1 ?   omeprazole (PRILOSEC) 40 MG capsule, TAKE 1 CAPSULE (40 MG TOTAL) BY MOUTH DAILY., Disp: 90 capsule, Rfl: 1 ?  tadalafil (CIALIS) 20 MG tablet, Take 1 tablet (20 mg total) by mouth daily as needed for erectile dysfunction., Disp: 30 tablet, Rfl: 0 ?  valACYclovir (VALTREX) 500 MG tablet, Take 1 tablet (500 mg total) by mouth 3 (three) times daily., Disp: 30 tablet, Rfl: 2 ? ?No Known Allergies ? ?I personally reviewed active problem list, medication list, allergies, family history, social history, health maintenance with the patient/caregiver today. ? ? ?ROS ? ?Constitutional: Negative for fever or weight change.  ?Respiratory: Negative for cough and shortness of breath.   ?Cardiovascular: Negative for chest pain or palpitations.  ?Gastrointestinal: Negative for abdominal pain, no bowel changes.  ?Musculoskeletal: Negative for gait problem or joint swelling.  ?Skin: positive  for rash.  ?Neurological: Negative for dizziness or headache.  ?No other specific complaints in a complete review of systems (except as listed in HPI above).  ? ?Objective ? ?Vitals:  ? 03/04/22 0821  ?BP: 136/76  ?Pulse: 91  ?Resp: 16  ?SpO2: 99%  ?Weight: 213 lb (96.6 kg)  ?Height: 5\' 11"  (1.803 m)  ? ? ?Body mass index is 29.71 kg/m?. ? ?Physical Exam ? ?Constitutional: Patient appears well-developed and well-nourished. Obese  No distress.  ?HEENT: head atraumatic, normocephalic, pupils equal and reactive to light, neck supple ?Cardiovascular: Normal rate, regular rhythm and normal heart sounds.  No murmur heard. No BLE edema. ?Pulmonary/Chest: Effort normal and breath sounds normal. No respiratory distress. ?Abdominal: Soft.  There is no tenderness. ?Skin: eczematous patches on sites of dexcom, hyperpigmented and pilling in some areas, no redness or oozing  ?Psychiatric: Patient has a normal mood and affect. behavior is normal. Judgment and thought content normal.  ? ?Recent Results (from the past 2160 hour(s))  ?POCT HgB A1C     Status:  Abnormal  ? Collection Time: 12/05/21  8:15 AM  ?Result Value Ref Range  ? Hemoglobin A1C 6.6 (A) 4.0 - 5.6 %  ? HbA1c POC (<> result, manual entry)    ? HbA1c, POC (prediabetic range)    ? HbA1c, POC (controlled diabetic range)    ? ? ? ?PHQ2/9: ? ?  03/04/2022  ?  8:21 AM 12/05/2021  ?  8:04 AM 09/03/2021  ?  7:31 AM 06/06/2021  ?  7:52 AM 02/20/2021  ? 11:32 AM  ?Depression screen PHQ 2/9  ?Decreased Interest 0 0 0 0 0  ?Down, Depressed, Hopeless 0 0 0 0 0  ?PHQ - 2 Score 0 0 0 0  0  ?Altered sleeping 0 0   0  ?Tired, decreased energy 0 0   0  ?Change in appetite 0 0   0  ?Feeling bad or failure about yourself  0 0   0  ?Trouble concentrating 0 0   0  ?Moving slowly or fidgety/restless 0 0   0  ?Suicidal thoughts 0 0   0  ?PHQ-9 Score 0 0   0  ?Difficult doing work/chores     Not difficult at all  ?  ?phq 9 is negative ? ? ?Fall Risk: ? ?  03/04/2022  ?  8:20 AM 12/05/2021  ?  8:03 AM 09/03/2021  ?  7:31 AM 06/06/2021  ?  7:52 AM 02/20/2021  ? 11:32 AM  ?Fall Risk   ?Falls in the past year? 0 0 0 0 0  ?Number falls in past yr: 0 0 0 0 0  ?Injury with Fall? 0 0 0 0 0  ?Risk for fall due to : No Fall Risks No Fall Risks No Fall Risks    ?Follow up Falls prevention discussed Falls prevention discussed Falls prevention discussed    ? ? ? ? ?Functional Status Survey: ?Is the patient deaf or have difficulty hearing?: No ?Does the patient have difficulty seeing, even when wearing glasses/contacts?: No ?Does the patient have difficulty concentrating, remembering, or making decisions?: No ?Does the patient have difficulty walking or climbing stairs?: No ?Does the patient have difficulty dressing or bathing?: No ?Does the patient have difficulty doing errands alone such as visiting a doctor's office or shopping?: No ? ? ? ?Assessment & Plan ? ?1. Dyslipidemia associated with type 2 diabetes mellitus (HCC) ? ?- HgB A1c ?- empagliflozin (JARDIANCE) 25 MG TABS tablet; Take 1 tablet (25 mg total) by mouth daily.  Dispense: 90 tablet;  Refill: 1 ? ?2. 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 ?- empagliflozin (JARDIANCE) 25 MG TABS tablet; Take 1 tablet (25 mg total) by mouth daily.  Dispense: 90 tablet; Refi

## 2022-03-04 ENCOUNTER — Ambulatory Visit: Payer: 59 | Admitting: Family Medicine

## 2022-03-04 ENCOUNTER — Encounter: Payer: Self-pay | Admitting: Family Medicine

## 2022-03-04 VITALS — BP 136/76 | HR 91 | Resp 16 | Ht 71.0 in | Wt 213.0 lb

## 2022-03-04 DIAGNOSIS — L231 Allergic contact dermatitis due to adhesives: Secondary | ICD-10-CM

## 2022-03-04 DIAGNOSIS — K219 Gastro-esophageal reflux disease without esophagitis: Secondary | ICD-10-CM

## 2022-03-04 DIAGNOSIS — N521 Erectile dysfunction due to diseases classified elsewhere: Secondary | ICD-10-CM

## 2022-03-04 DIAGNOSIS — E1140 Type 2 diabetes mellitus with diabetic neuropathy, unspecified: Secondary | ICD-10-CM

## 2022-03-04 DIAGNOSIS — N529 Male erectile dysfunction, unspecified: Secondary | ICD-10-CM

## 2022-03-04 DIAGNOSIS — E7800 Pure hypercholesterolemia, unspecified: Secondary | ICD-10-CM

## 2022-03-04 DIAGNOSIS — I10 Essential (primary) hypertension: Secondary | ICD-10-CM

## 2022-03-04 DIAGNOSIS — E1159 Type 2 diabetes mellitus with other circulatory complications: Secondary | ICD-10-CM

## 2022-03-04 DIAGNOSIS — E785 Hyperlipidemia, unspecified: Secondary | ICD-10-CM

## 2022-03-04 DIAGNOSIS — I152 Hypertension secondary to endocrine disorders: Secondary | ICD-10-CM

## 2022-03-04 DIAGNOSIS — E1169 Type 2 diabetes mellitus with other specified complication: Secondary | ICD-10-CM

## 2022-03-04 MED ORDER — TRIAMCINOLONE ACETONIDE 0.1 % EX CREA: 1.0000 "application " | TOPICAL_CREAM | Freq: Two times a day (BID) | CUTANEOUS | 0 refills | Status: DC

## 2022-03-04 MED ORDER — BD PEN NEEDLE MICRO U/F 32G X 6 MM MISC: 1.0000 | Freq: Four times a day (QID) | 1 refills | Status: DC

## 2022-03-04 MED ORDER — AMLODIPINE BESY-BENAZEPRIL HCL 5-40 MG PO CAPS: 1.0000 | ORAL_CAPSULE | Freq: Every day | ORAL | 1 refills | Status: DC

## 2022-03-04 MED ORDER — EMPAGLIFLOZIN 25 MG PO TABS: 25.0000 mg | ORAL_TABLET | Freq: Every day | ORAL | 1 refills | Status: DC

## 2022-03-04 MED ORDER — TADALAFIL 20 MG PO TABS: 20.0000 mg | ORAL_TABLET | Freq: Every day | ORAL | 0 refills | Status: DC | PRN

## 2022-03-05 LAB — CBC WITH DIFFERENTIAL/PLATELET
Absolute Monocytes: 248 cells/uL (ref 200–950)
Basophils Absolute: 18 cells/uL (ref 0–200)
Basophils Relative: 0.4 %
Eosinophils Absolute: 59 cells/uL (ref 15–500)
Eosinophils Relative: 1.3 %
HCT: 46.4 % (ref 38.5–50.0)
Hemoglobin: 15.2 g/dL (ref 13.2–17.1)
Lymphs Abs: 1400 cells/uL (ref 850–3900)
MCH: 30 pg (ref 27.0–33.0)
MCHC: 32.8 g/dL (ref 32.0–36.0)
MCV: 91.5 fL (ref 80.0–100.0)
MPV: 13.6 fL — ABNORMAL HIGH (ref 7.5–12.5)
Monocytes Relative: 5.5 %
Neutro Abs: 2777 cells/uL (ref 1500–7800)
Neutrophils Relative %: 61.7 %
Platelets: 148 10*3/uL (ref 140–400)
RBC: 5.07 10*6/uL (ref 4.20–5.80)
RDW: 13.3 % (ref 11.0–15.0)
Total Lymphocyte: 31.1 %
WBC: 4.5 10*3/uL (ref 3.8–10.8)

## 2022-03-05 LAB — COMPLETE METABOLIC PANEL WITH GFR
AG Ratio: 1.7 (calc) (ref 1.0–2.5)
ALT: 20 U/L (ref 9–46)
AST: 18 U/L (ref 10–35)
Albumin: 4.2 g/dL (ref 3.6–5.1)
Alkaline phosphatase (APISO): 65 U/L (ref 35–144)
BUN: 22 mg/dL (ref 7–25)
CO2: 30 mmol/L (ref 20–32)
Calcium: 9.1 mg/dL (ref 8.6–10.3)
Chloride: 105 mmol/L (ref 98–110)
Creat: 1.18 mg/dL (ref 0.70–1.30)
Globulin: 2.5 g/dL (calc) (ref 1.9–3.7)
Glucose, Bld: 142 mg/dL — ABNORMAL HIGH (ref 65–99)
Potassium: 4.1 mmol/L (ref 3.5–5.3)
Sodium: 142 mmol/L (ref 135–146)
Total Bilirubin: 1.6 mg/dL — ABNORMAL HIGH (ref 0.2–1.2)
Total Protein: 6.7 g/dL (ref 6.1–8.1)
eGFR: 72 mL/min/{1.73_m2} (ref >=60)

## 2022-03-05 LAB — LIPID PANEL
Cholesterol: 177 mg/dL (ref <200)
HDL: 44 mg/dL (ref >=40)
LDL Cholesterol (Calc): 100 mg/dL (calc) — ABNORMAL HIGH
Non-HDL Cholesterol (Calc): 133 mg/dL (calc) — ABNORMAL HIGH (ref <130)
Total CHOL/HDL Ratio: 4 (calc) (ref <5.0)
Triglycerides: 210 mg/dL — ABNORMAL HIGH (ref <150)

## 2022-03-05 LAB — HEMOGLOBIN A1C
Hgb A1c MFr Bld: 7.6 % of total Hgb — ABNORMAL HIGH (ref <5.7)
Mean Plasma Glucose: 171 mg/dL
eAG (mmol/L): 9.5 mmol/L

## 2022-03-05 LAB — MICROALBUMIN / CREATININE URINE RATIO
Creatinine, Urine: 90 mg/dL (ref 20–320)
Microalb, Ur: <0.2 mg/dL

## 2022-04-11 LAB — HM DIABETES EYE EXAM

## 2022-06-05 ENCOUNTER — Other Ambulatory Visit: Payer: Self-pay | Admitting: Family Medicine

## 2022-06-05 DIAGNOSIS — E118 Type 2 diabetes mellitus with unspecified complications: Secondary | ICD-10-CM

## 2022-06-05 NOTE — Telephone Encounter (Signed)
Requested Prescriptions  Pending Prescriptions Disp Refills  . NOVOLOG FLEXPEN 100 UNIT/ML FlexPen [Pharmacy Med Name: NOVOLOG 100 UNIT/ML FLEXPEN] 45 mL 0    Sig: INJECT 3-15 UNITS INTO THE SKIN 3 (THREE) TIMES DAILY WITH MEALS.     Endocrinology:  Diabetes - Insulins Passed - 06/05/2022 12:11 PM      Passed - HBA1C is between 0 and 7.9 and within 180 days    HbA1c, POC (controlled diabetic range)  Date Value Ref Range Status  07/04/2019 6.8 0.0 - 7.0 % Final   Hgb A1c MFr Bld  Date Value Ref Range Status  03/04/2022 7.6 (H) <5.7 % of total Hgb Final    Comment:    For someone without known diabetes, a hemoglobin A1c value of 6.5% or greater indicates that they may have  diabetes and this should be confirmed with a follow-up  test. . For someone with known diabetes, a value <7% indicates  that their diabetes is well controlled and a value  greater than or equal to 7% indicates suboptimal  control. A1c targets should be individualized based on  duration of diabetes, age, comorbid conditions, and  other considerations. . Currently, no consensus exists regarding use of hemoglobin A1c for diagnosis of diabetes for children. Verna Czech - Valid encounter within last 6 months    Recent Outpatient Visits          3 months ago Dyslipidemia associated with type 2 diabetes mellitus [facility])   [facility] [place], Danna Hefty, MD   6 months ago Dyslipidemia associated with type 2 diabetes mellitus [facility])   [facility] Alba Cory, MD   9 months ago Diabetes mellitus type 2, with complication, on long term insulin pump [facility]   [facility] [place], Danna Hefty, MD   12 months ago Diabetes mellitus type 2, with complication, on long term insulin pump [facility])   [facility] [place], Danna Hefty, MD   1 year ago Type II diabetes mellitus with neuropathy causing erectile dysfunction [facility])   [facility] Alba Cory, MD      Future Appointments            In 1 week Alba Cory, MD [facility], [facility]

## 2022-06-10 ENCOUNTER — Other Ambulatory Visit: Payer: Self-pay | Admitting: Family Medicine

## 2022-06-10 DIAGNOSIS — N529 Male erectile dysfunction, unspecified: Secondary | ICD-10-CM

## 2022-06-10 MED ORDER — TADALAFIL 20 MG PO TABS: 20.0000 mg | ORAL_TABLET | Freq: Every day | ORAL | 0 refills | Status: DC | PRN

## 2022-06-11 NOTE — Progress Notes (Signed)
Name: Joseph Johnson   MRN: [id number]    DOB: [birth year]   Date:06/12/2022       Progress Note  Subjective  Chief Complaint  Follow Up  HPI  DMII: He saw Dr. Tedd Sias in June of 2019 but because of cost of seeing sub-specialist  and A1C has improved he is back here for DM management.  A1C was 6.8% ,7.4%, 6.8% and it went up to 8 % Spring 2021 it went up again to 8.8 % , 7.9 % 7.5 %, 6 % and switched from Xultophy to [place] A1C  6.6 %.it went up to 7.6 % but now is back to goal at 6.6 %. He states using [place] mid afternoon , eating less carbs and exercising more   He denies polyphagia, polydipsia or polyuria. He is on statin therapy, ACE for kidney and also takes Cialis for ED. He started developed a rash to the adhesive of Dexcom sensor about 4 months ago, he finally stopped using the sensor about one month ago.  Discussed Dexcom G7  HTN: he  has been compliant with medication, back on Lotrel. No chest pain , dizziness or palpitation  BP is at goal . Continue medication  Hyperlipidemia: lipid panel had going up from 70 to 100, he has been compliant with diet now and we will continue current dose of medications  ED: he states Cialis prn and it works well for him, he needs a refill today   GERD: taking Omeprazole and states no heartburn or indigestion as long as he takes medication daily   Herpes Genitalis: he started to have outbreaks in his 20's, he states last episode was over two years ago, he had an episodes April 2021 but nothing since, he does not take suppressive medication since married and episodes are infrequent .Unchanged   Patient Active Problem List   Diagnosis Date Noted   Type 2 diabetes mellitus with hyperglycemia, with long-term current use of insulin (HCC) 03/11/2018   Allergic rhinitis, seasonal 03/03/2016   ED (erectile dysfunction) 06/21/2015   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  Family History  Problem Relation Age of Onset   Hypertension Mother    Diabetes Father    Diabetes Brother    Hypercalcemia Brother    Hypertension Brother     Social History   Tobacco Use   Smoking status: Former    Types: Cigarettes    Quit date: 09/19/1990    Years since quitting: 31.7   Smokeless tobacco: Never   Tobacco comments:    more than 20 years ago  Substance Use Topics   Alcohol use: No    Alcohol/week: 0.0 standard drinks of alcohol     Current Outpatient Medications:    ACCU-CHEK GUIDE test strip, 1 EACH BY OTHER ROUTE 4 (FOUR) TIMES DAILY - BEFORE MEALS AND AT BEDTIME. USE AS INSTRUCTED, Disp: 400 strip, Rfl: 3   aspirin 81 MG tablet, , Disp: , Rfl:    fluticasone (FLONASE) 50 MCG/ACT nasal spray, Place 2 sprays into both nostrils daily., Disp: 48 g, Rfl: 0   Glucagon (GVOKE HYPOPEN 1-PACK) 1 MG/0.2ML SOAJ, Inject 1 each into the skin daily as needed. Severe hypoglycemia, Disp: 0.2 mL, Rfl: 1   Insulin Pen Needle (BD PEN NEEDLE MICRO U/F) 32G X 6 MM MISC, Inject 1 each into the skin 4 (four)  times daily., Disp: 150 each, Rfl: 1   levocetirizine (XYZAL) 5 MG tablet, TAKE 1 TABLET (5 MG TOTAL) BY MOUTH EVERY EVENING., Disp: 90 tablet, Rfl: 0   NOVOLOG FLEXPEN 100 UNIT/ML FlexPen, INJECT 3-15 UNITS INTO THE SKIN 3 (THREE) TIMES DAILY WITH MEALS., Disp: 45 mL, Rfl: 0   triamcinolone cream (KENALOG) 0.1 %, Apply 1 application. topically 2 (two) times daily., Disp: 45 g, Rfl: 0   valACYclovir (VALTREX) 500 MG tablet, Take 1 tablet (500 mg total) by mouth 3 (three) times daily., Disp: 30 tablet, Rfl: 2   amLODipine-benazepril (LOTREL) 5-40 MG capsule, Take 1 capsule by mouth daily., Disp: 90 capsule, Rfl: 1   atorvastatin (LIPITOR) 80 MG tablet, Take 1 tablet (80 mg total) by mouth daily., Disp: 90 tablet, Rfl: 1   empagliflozin (JARDIANCE) 25 MG TABS tablet, Take 1 tablet (25 mg total) by mouth  daily., Disp: 90 tablet, Rfl: 1   Insulin Glargine-Lixisenatide (SOLIQUA) 100-33 UNT-MCG/ML SOPN, Inject 60 Units into the skin daily at 12 noon., Disp: 60 mL, Rfl: 1   omeprazole (PRILOSEC) 40 MG capsule, Take 1 capsule (40 mg total) by mouth daily., Disp: 90 capsule, Rfl: 1   tadalafil (CIALIS) 20 MG tablet, Take 1 tablet (20 mg total) by mouth every 3 (three) days., Disp: 30 tablet, Rfl: 0  No Known Allergies  I personally reviewed active problem list, medication list, allergies, family history, social history, health maintenance with the patient/caregiver today.   ROS  Constitutional: Negative for fever or weight change.  Respiratory: Negative for cough and shortness of breath.   Cardiovascular: Negative for chest pain or palpitations.  Gastrointestinal: Negative for abdominal pain, no bowel changes.  Musculoskeletal: Negative for gait problem or joint swelling.  Skin: Negative for rash.  Neurological: Negative for dizziness or headache.  No other specific complaints in a complete review of systems (except as listed in HPI above).   Objective  Vitals:   06/12/22 0800  BP: 132/82  Pulse: 82  Resp: 16  SpO2: 97%  Weight: 213 lb (96.6 kg)  Height: 5\' 10"  (1.778 m)    Body mass index is 30.56 kg/m.  Physical Exam  Constitutional: Patient appears well-developed and well-nourished. Obese  No distress.  HEENT: head atraumatic, normocephalic, pupils equal and reactive to light, neck supple Cardiovascular: Normal rate, regular rhythm and normal heart sounds.  No murmur heard. No BLE edema. Pulmonary/Chest: Effort normal and breath sounds normal. No respiratory distress. Abdominal: Soft.  There is no tenderness. Psychiatric: Patient has a normal mood and affect. behavior is normal. Judgment and thought content normal.   Recent Results (from the past 2160 hour(s))  POCT HgB A1C     Status: Abnormal   Collection Time: 06/12/22  8:03 AM  Result Value Ref Range   Hemoglobin A1C  6.6 (A) 4.0 - 5.6 %   HbA1c POC (<> result, manual entry)     HbA1c, POC (prediabetic range)     HbA1c, POC (controlled diabetic range)      Diabetic Foot Exam: Diabetic Foot Exam - Simple   Simple Foot Form Visual Inspection No deformities, no ulcerations, no other skin breakdown bilaterally: Yes Sensation Testing Intact to touch and monofilament testing bilaterally: Yes Pulse Check Posterior Tibialis and Dorsalis pulse intact bilaterally: Yes Comments      PHQ2/9:    06/12/2022    8:00 AM 03/04/2022    8:21 AM 12/05/2021    8:04 AM 09/03/2021    7:31 AM 06/06/2021  7:52 AM  Depression screen PHQ 2/9  Decreased Interest 0 0 0 0 0  Down, Depressed, Hopeless 0 0 0 0 0  PHQ - 2 Score 0 0 0 0 0  Altered sleeping 0 0 0    Tired, decreased energy 0 0 0    Change in appetite 0 0 0    Feeling bad or failure about yourself  0 0 0    Trouble concentrating 0 0 0    Moving slowly or fidgety/restless 0 0 0    Suicidal thoughts 0 0 0    PHQ-9 Score 0 0 0      phq 9 is negative   Fall Risk:    06/12/2022    8:00 AM 03/04/2022    8:20 AM 12/05/2021    8:03 AM 09/03/2021    7:31 AM 06/06/2021    7:52 AM  Fall Risk   Falls in the past year? 0 0 0 0 0  Number falls in past yr: 0 0 0 0 0  Injury with Fall? 0 0 0 0 0  Risk for fall due to : No Fall Risks No Fall Risks No Fall Risks No Fall Risks   Follow up Falls prevention discussed Falls prevention discussed Falls prevention discussed Falls prevention discussed       Functional Status Survey: Is the patient deaf or have difficulty hearing?: No Does the patient have difficulty seeing, even when wearing glasses/contacts?: No Does the patient have difficulty concentrating, remembering, or making decisions?: No Does the patient have difficulty walking or climbing stairs?: No Does the patient have difficulty dressing or bathing?: No Does the patient have difficulty doing errands alone such as visiting a doctor's office or  shopping?: No    Assessment & Plan  1. Dyslipidemia associated with type 2 diabetes mellitus (HCC)  - POCT HgB A1C - HM Diabetes Foot Exam - Insulin Glargine-Lixisenatide (SOLIQUA) 100-33 UNT-MCG/ML SOPN; Inject 60 Units into the skin daily at 12 noon.  Dispense: 60 mL; Refill: 1 - empagliflozin (JARDIANCE) 25 MG TABS tablet; Take 1 tablet (25 mg total) by mouth daily.  Dispense: 90 tablet; Refill: 1  2. Gastroesophageal reflux disease without esophagitis  - omeprazole (PRILOSEC) 40 MG capsule; Take 1 capsule (40 mg total) by mouth daily.  Dispense: 90 capsule; Refill: 1  3. Benign essential HTN   4. Hypertension complicating diabetes (HCC)  - amLODipine-benazepril (LOTREL) 5-40 MG capsule; Take 1 capsule by mouth daily.  Dispense: 90 capsule; Refill: 1 - empagliflozin (JARDIANCE) 25 MG TABS tablet; Take 1 tablet (25 mg total) by mouth daily.  Dispense: 90 tablet; Refill: 1  5. Hypercholesteremia  - atorvastatin (LIPITOR) 80 MG tablet; Take 1 tablet (80 mg total) by mouth daily.  Dispense: 90 tablet; Refill: 1  6. ED (erectile dysfunction) of organic origin  - tadalafil (CIALIS) 20 MG tablet; Take 1 tablet (20 mg total) by mouth every 3 (three) days.  Dispense: 30 tablet; Refill: 0  7. 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  Dispense: 60 mL; Refill: 1 - empagliflozin (JARDIANCE) 25 MG TABS tablet; Take 1 tablet (25 mg total) by mouth daily.  Dispense: 90 tablet; Refill: 1

## 2022-06-12 ENCOUNTER — Ambulatory Visit: Payer: 59 | Admitting: Family Medicine

## 2022-06-12 ENCOUNTER — Encounter: Payer: Self-pay | Admitting: Family Medicine

## 2022-06-12 VITALS — BP 132/82 | HR 82 | Resp 16 | Ht 70.0 in | Wt 213.0 lb

## 2022-06-12 DIAGNOSIS — K219 Gastro-esophageal reflux disease without esophagitis: Secondary | ICD-10-CM | POA: Diagnosis not present

## 2022-06-12 DIAGNOSIS — I10 Essential (primary) hypertension: Secondary | ICD-10-CM | POA: Diagnosis not present

## 2022-06-12 DIAGNOSIS — E785 Hyperlipidemia, unspecified: Secondary | ICD-10-CM

## 2022-06-12 DIAGNOSIS — E119 Type 2 diabetes mellitus without complications: Secondary | ICD-10-CM

## 2022-06-12 DIAGNOSIS — I152 Hypertension secondary to endocrine disorders: Secondary | ICD-10-CM

## 2022-06-12 DIAGNOSIS — Z1211 Encounter for screening for malignant neoplasm of colon: Secondary | ICD-10-CM

## 2022-06-12 DIAGNOSIS — N529 Male erectile dysfunction, unspecified: Secondary | ICD-10-CM

## 2022-06-12 DIAGNOSIS — E1169 Type 2 diabetes mellitus with other specified complication: Secondary | ICD-10-CM

## 2022-06-12 DIAGNOSIS — N521 Erectile dysfunction due to diseases classified elsewhere: Secondary | ICD-10-CM

## 2022-06-12 DIAGNOSIS — E1159 Type 2 diabetes mellitus with other circulatory complications: Secondary | ICD-10-CM

## 2022-06-12 DIAGNOSIS — E7800 Pure hypercholesterolemia, unspecified: Secondary | ICD-10-CM

## 2022-06-12 DIAGNOSIS — E1140 Type 2 diabetes mellitus with diabetic neuropathy, unspecified: Secondary | ICD-10-CM

## 2022-06-12 LAB — POCT GLYCOSYLATED HEMOGLOBIN (HGB A1C): Hemoglobin A1C: 6.6 % — AB (ref 4.0–5.6)

## 2022-06-12 MED ORDER — TADALAFIL 20 MG PO TABS: 20.0000 mg | ORAL_TABLET | ORAL | 0 refills | Status: DC

## 2022-06-12 MED ORDER — OMEPRAZOLE 40 MG PO CPDR: 40.0000 mg | DELAYED_RELEASE_CAPSULE | Freq: Every day | ORAL | 1 refills | Status: DC

## 2022-06-12 MED ORDER — SOLIQUA 100-33 UNT-MCG/ML ~~LOC~~ SOPN: 60.0000 [IU] | PEN_INJECTOR | Freq: Every day | SUBCUTANEOUS | 1 refills | Status: DC

## 2022-06-12 MED ORDER — ATORVASTATIN CALCIUM 80 MG PO TABS: 80.0000 mg | ORAL_TABLET | Freq: Every day | ORAL | 1 refills | Status: DC

## 2022-06-12 MED ORDER — EMPAGLIFLOZIN 25 MG PO TABS: 25.0000 mg | ORAL_TABLET | Freq: Every day | ORAL | 1 refills | Status: DC

## 2022-06-12 MED ORDER — AMLODIPINE BESY-BENAZEPRIL HCL 5-40 MG PO CAPS: 1.0000 | ORAL_CAPSULE | Freq: Every day | ORAL | 1 refills | Status: DC

## 2022-07-07 LAB — COLOGUARD: COLOGUARD: NEGATIVE

## 2022-09-11 NOTE — Progress Notes (Signed)
Name: Joseph Johnson   MRN: [id number]    DOB: February 09, 1961   Date:09/14/2022       Progress Note  Subjective  Chief Complaint  Follow Up  HPI  DMII: He saw Dr. Gabriel Carina in June of 2019 but because of cost of seeing sub-specialist  and A1C has improved he is back here for DM management.  A1C was 6.8% ,7.4%, 6.8% and it went up to 8 % Spring 2021 it went up to 8.8 % , 7.9 % 7.5 %, 6 % and switched from Xultophy to [place] A1C  6.6 %.it went up to 7.6 % down  6.6 % and today is 6.8 %    He denies polyphagia, polydipsia or polyuria. He is on statin therapy, ACE for kidney and also takes Cialis for ED. He started developed a rash to the adhesive of Dexcom sensor about 4 months ago, he finally stopped using the sensor about one month ago.  Discussed Dexcom G7. He states he skips lunch and is taking [place] different times of the afternoon, advised to take [place] before breakfast since he always eats breakfast He skips his oral medications a few times a week.   HTN: he  has been compliant with medication, back on Lotrel. No chest pain , dizziness or palpitation  BP has been 123XX123 systolic, he skips doses of medication   Hyperlipidemia: lipid panel had going up from 70 to 100, he has been eating fried food more often He is not very compliant with statin therapy.   ED: he states Cialis prn and it works well for him.  GERD: taking Omeprazole and states no heartburn or indigestion lately.   Herpes Genitalis: he started to have outbreaks in his 20's, he states last episode was over two years ago, he had an episodes April 2021 but nothing since, he does not take suppressive medication since married and episodes are infrequent Unchanged   Patient Active Problem List   Diagnosis Date Noted   Type 2 diabetes mellitus with hyperglycemia, with long-term current use of insulin ([place]) 03/11/2018   Allergic rhinitis, seasonal 03/03/2016   ED (erectile dysfunction) 06/21/2015   Type II diabetes mellitus with  neuropathy causing erectile dysfunction ([place]) 06/20/2015   Hypercholesteremia 06/20/2015   Benign essential HTN 02/02/2008    No past surgical history on file.  Family History  Problem Relation Age of Onset   Hypertension Mother    Diabetes Father    Diabetes Brother    Hypercalcemia Brother    Hypertension Brother     Social History   Tobacco Use   Smoking status: Former    Types: Cigarettes    Quit date: 09/19/1990    Years since quitting: 32.0   Smokeless tobacco: Never   Tobacco comments:    more than 20 years ago  Substance Use Topics   Alcohol use: No    Alcohol/week: 0.0 standard drinks of alcohol     Current Outpatient Medications:    ACCU-CHEK GUIDE test strip, 1 EACH BY OTHER ROUTE 4 (FOUR) TIMES DAILY - BEFORE MEALS AND AT BEDTIME. USE AS INSTRUCTED, Disp: 400 strip, Rfl: 3   amLODipine-benazepril (LOTREL) 5-40 MG capsule, Take 1 capsule by mouth daily., Disp: 90 capsule, Rfl: 1   aspirin 81 MG tablet, , Disp: , Rfl:    atorvastatin (LIPITOR) 80 MG tablet, Take 1 tablet (80 mg total) by mouth daily., Disp: 90 tablet, Rfl: 1   empagliflozin (JARDIANCE) 25 MG TABS tablet, Take 1 tablet (25  mg total) by mouth daily., Disp: 90 tablet, Rfl: 1   fluticasone (FLONASE) 50 MCG/ACT nasal spray, Place 2 sprays into both nostrils daily., Disp: 48 g, Rfl: 0   Glucagon (GVOKE HYPOPEN 1-PACK) 1 MG/0.2ML SOAJ, Inject 1 each into the skin daily as needed. Severe hypoglycemia, Disp: 0.2 mL, Rfl: 1   Insulin Glargine-Lixisenatide (SOLIQUA) 100-33 UNT-MCG/ML SOPN, Inject 60 Units into the skin daily at 12 noon., Disp: 60 mL, Rfl: 1   Insulin Pen Needle (BD PEN NEEDLE MICRO U/F) 32G X 6 MM MISC, Inject 1 each into the skin 4 (four) times daily., Disp: 150 each, Rfl: 1   levocetirizine (XYZAL) 5 MG tablet, TAKE 1 TABLET (5 MG TOTAL) BY MOUTH EVERY EVENING., Disp: 90 tablet, Rfl: 0   NOVOLOG FLEXPEN 100 UNIT/ML FlexPen, INJECT 3-15 UNITS INTO THE SKIN 3 (THREE) TIMES DAILY WITH MEALS.,  Disp: 45 mL, Rfl: 0   omeprazole (PRILOSEC) 40 MG capsule, Take 1 capsule (40 mg total) by mouth daily., Disp: 90 capsule, Rfl: 1   tadalafil (CIALIS) 20 MG tablet, Take 1 tablet (20 mg total) by mouth every 3 (three) days., Disp: 30 tablet, Rfl: 0   triamcinolone cream (KENALOG) 0.1 %, Apply 1 application. topically 2 (two) times daily., Disp: 45 g, Rfl: 0   valACYclovir (VALTREX) 500 MG tablet, Take 1 tablet (500 mg total) by mouth 3 (three) times daily., Disp: 30 tablet, Rfl: 2  No Known Allergies  I personally reviewed active problem list, medication list, allergies, family history, social history, health maintenance with the patient/caregiver today.   ROS  Constitutional: Negative for fever or weight change.  Respiratory: Negative for cough and shortness of breath.   Cardiovascular: Negative for chest pain or palpitations.  Gastrointestinal: Negative for abdominal pain, no bowel changes.  Musculoskeletal: Negative for gait problem or joint swelling.  Skin: Negative for rash.  Neurological: Negative for dizziness or headache.  No other specific complaints in a complete review of systems (except as listed in HPI above).   Objective  Vitals:   09/14/22 0821  BP: 132/72  Pulse: 88  Resp: 16  SpO2: 98%  Weight: 212 lb (96.2 kg)  Height: 5\' 10"  (1.778 m)    Body mass index is 30.42 kg/m.  Physical Exam  Constitutional: Patient appears well-developed and well-nourished. Obese  No distress.  HEENT: head atraumatic, normocephalic, pupils equal and reactive to light, neck supple Cardiovascular: Normal rate, regular rhythm and normal heart sounds.  No murmur heard. No BLE edema. Pulmonary/Chest: Effort normal and breath sounds normal. No respiratory distress. Abdominal: Soft.  There is no tenderness. Psychiatric: Patient has a normal mood and affect. behavior is normal. Judgment and thought content normal.   Recent Results (from the past 2160 hour(s))  Cologuard     Status:  None   Collection Time: 06/29/22 10:00 AM  Result Value Ref Range   COLOGUARD Negative Negative    Comment:  NEGATIVE TEST RESULT. A negative Cologuard result indicates a low likelihood that a colorectal cancer (CRC) or advanced adenoma (adenomatous polyps with more advanced pre-malignant features)  is present. The chance that a person with a negative Cologuard test has a colorectal cancer is less than 1 in 1500 (negative predictive value >99.9%) or has an  advanced adenoma is less than  5.3% (negative predictive value 94.7%). These data are based on a prospective cross-sectional study of 10,000 individuals at average risk for colorectal cancer who were screened with both Cologuard and colonoscopy. (Imperiale T. et al,  N Engl J Med 2014;370(14):1286-1297) The normal value (reference range) for this assay is negative.  COLOGUARD RE-SCREENING RECOMMENDATION: Periodic colorectal cancer screening is an important part of preventive healthcare for asymptomatic individuals at average risk for colorectal cancer.  Following a negative Cologuard result, the American Cancer Society and U.S.  Multi-Society Task Force screening guidelines recommend a Cologuard re-screening interval of 3 years.  References: American Cancer Society Guideline for Colorectal Cancer Screening: https://www.cancer.org/cancer/colon-rectal-cancer/detection-diagnosis-staging/acs-recommendations.html.; Rex DK, Boland CR, Dominitz JK, Colorectal Cancer Screening: Recommendations for Physicians and Patients from the U.S. Multi-Society Task Force on Colorectal Cancer Screening , Am J Gastroenterology 2017; 112:1016-1030.  TEST DESCRIPTION: Composite algorithmic analysis of stool DNA-biomarkers with hemoglobin immunoassay.   Quantitative values of individual biomarkers are not reportable and are not associated with individual biomarker result reference ranges. Cologuard is intended for colorectal cancer screening of adults of either sex, 45 years or  older, who are at average-risk for colorectal cancer (CRC). Cologuard has been approved for use by the U.S. FDA. The performance of Cologuard was  established in a cross sectional study of average-risk adults aged 950-84. Cologuard performance in patients ages 4145 to 8049 years was estimated by sub-group analysis of near-age groups. Colonoscopies performed for a positive result may find as the most clinically significant lesion: colorectal cancer [4.0%], advanced adenoma (including sessile serrated polyps greater than or equal to 1cm diameter) [20%] or non- advanced adenoma [31%]; or no colorectal neoplasia [45%]. These estimates are derived from a prospective cross-sectional screening study of 10,000 individuals at average risk for colorectal cancer who were screened with both Cologuard and colonoscopy. (Imperiale T. et al, Macy MisN Engl J Med 2014;370(14):1286-1297.) Cologuard may produce a false negative or false positive result (no colorectal cancer or precancerous polyp present at colonoscopy follow up). A negative Cologuard test result does not guarantee the absence of CRC or advanced adenoma (pre-cancer). The current Cologuard  screening interval is every 3 years. Science writer(American Cancer Society and U.S. Therapist, musicMulti-Society Task Force). Cologuard performance data in a 10,000 patient pivotal study using colonoscopy as the reference method can be accessed at the following location: www.exactlabs.com/results. Additional description of the Cologuard test process, warnings and precautions can be found at www.cologuard.com.   POCT HgB A1C     Status: Abnormal   Collection Time: 09/14/22  8:23 AM  Result Value Ref Range   Hemoglobin A1C 6.8 (A) 4.0 - 5.6 %   HbA1c POC (<> result, manual entry)     HbA1c, POC (prediabetic range)     HbA1c, POC (controlled diabetic range)      PHQ2/9:    09/14/2022    8:22 AM 06/12/2022    8:00 AM 03/04/2022    8:21 AM 12/05/2021    8:04 AM 09/03/2021    7:31 AM  Depression screen PHQ 2/9   Decreased Interest 0 0 0 0 0  Down, Depressed, Hopeless 0 0 0 0 0  PHQ - 2 Score 0 0 0 0 0  Altered sleeping 0 0 0 0   Tired, decreased energy 0 0 0 0   Change in appetite 0 0 0 0   Feeling bad or failure about yourself  0 0 0 0   Trouble concentrating 0 0 0 0   Moving slowly or fidgety/restless 0 0 0 0   Suicidal thoughts 0 0 0 0   PHQ-9 Score 0 0 0 0     phq 9 is negative   Fall Risk:    09/14/2022    8:22  AM 06/12/2022    8:00 AM 03/04/2022    8:20 AM 12/05/2021    8:03 AM 09/03/2021    7:31 AM  Fall Risk   Falls in the past year? 0 0 0 0 0  Number falls in past yr: 0 0 0 0 0  Injury with Fall? 0 0 0 0 0  Risk for fall due to : No Fall Risks No Fall Risks No Fall Risks No Fall Risks No Fall Risks  Follow up Falls prevention discussed Falls prevention discussed Falls prevention discussed Falls prevention discussed Falls prevention discussed      Functional Status Survey: Is the patient deaf or have difficulty hearing?: No Does the patient have difficulty seeing, even when wearing glasses/contacts?: No Does the patient have difficulty concentrating, remembering, or making decisions?: No Does the patient have difficulty walking or climbing stairs?: No Does the patient have difficulty dressing or bathing?: No Does the patient have difficulty doing errands alone such as visiting a doctor's office or shopping?: No    Assessment & Plan  1. Dyslipidemia associated with type 2 diabetes mellitus (HCC)  - POCT HgB A1C - insulin aspart (NOVOLOG FLEXPEN) 100 UNIT/ML FlexPen; Inject 3-15 Units into the skin 3 (three) times daily with meals.  Dispense: 45 mL; Refill: 1  2. Hypertension complicating diabetes ([place])   3. Need for immunization against influenza  - Flu Vaccine QUAD 6+ mos PF IM (Fluarix Quad PF)  4. ED (erectile dysfunction) of organic origin  - tadalafil (CIALIS) 20 MG tablet; Take 1 tablet (20 mg total) by mouth every 3 (three) days.  Dispense: 30 tablet;  Refill: 0  5. Gastroesophageal reflux disease without esophagitis   6. Hypercholesteremia   7. Type II diabetes mellitus with neuropathy causing erectile dysfunction (HCC)  - tadalafil (CIALIS) 20 MG tablet; Take 1 tablet (20 mg total) by mouth every 3 (three) days.  Dispense: 30 tablet; Refill: 0

## 2022-09-14 ENCOUNTER — Encounter: Payer: Self-pay | Admitting: Family Medicine

## 2022-09-14 ENCOUNTER — Ambulatory Visit: Payer: 59 | Admitting: Family Medicine

## 2022-09-14 VITALS — BP 132/72 | HR 88 | Resp 16 | Ht 70.0 in | Wt 212.0 lb

## 2022-09-14 DIAGNOSIS — E1159 Type 2 diabetes mellitus with other circulatory complications: Secondary | ICD-10-CM | POA: Insufficient documentation

## 2022-09-14 DIAGNOSIS — I152 Hypertension secondary to endocrine disorders: Secondary | ICD-10-CM

## 2022-09-14 DIAGNOSIS — E7800 Pure hypercholesterolemia, unspecified: Secondary | ICD-10-CM

## 2022-09-14 DIAGNOSIS — E1169 Type 2 diabetes mellitus with other specified complication: Secondary | ICD-10-CM

## 2022-09-14 DIAGNOSIS — K219 Gastro-esophageal reflux disease without esophagitis: Secondary | ICD-10-CM

## 2022-09-14 DIAGNOSIS — E785 Hyperlipidemia, unspecified: Secondary | ICD-10-CM | POA: Insufficient documentation

## 2022-09-14 DIAGNOSIS — Z23 Encounter for immunization: Secondary | ICD-10-CM | POA: Diagnosis not present

## 2022-09-14 DIAGNOSIS — N521 Erectile dysfunction due to diseases classified elsewhere: Secondary | ICD-10-CM

## 2022-09-14 DIAGNOSIS — N529 Male erectile dysfunction, unspecified: Secondary | ICD-10-CM

## 2022-09-14 DIAGNOSIS — E1140 Type 2 diabetes mellitus with diabetic neuropathy, unspecified: Secondary | ICD-10-CM

## 2022-09-14 DIAGNOSIS — Z794 Long term (current) use of insulin: Secondary | ICD-10-CM | POA: Insufficient documentation

## 2022-09-14 LAB — POCT GLYCOSYLATED HEMOGLOBIN (HGB A1C): Hemoglobin A1C: 6.8 % — AB (ref 4.0–5.6)

## 2022-09-14 MED ORDER — TADALAFIL 20 MG PO TABS: 20.0000 mg | ORAL_TABLET | ORAL | 0 refills | Status: DC

## 2022-09-14 MED ORDER — NOVOLOG FLEXPEN 100 UNIT/ML ~~LOC~~ SOPN: 3.0000 [IU] | PEN_INJECTOR | Freq: Three times a day (TID) | SUBCUTANEOUS | 1 refills | Status: DC

## 2022-11-25 ENCOUNTER — Other Ambulatory Visit: Payer: Self-pay | Admitting: Family Medicine

## 2022-11-25 DIAGNOSIS — E1169 Type 2 diabetes mellitus with other specified complication: Secondary | ICD-10-CM

## 2022-12-15 NOTE — Progress Notes (Deleted)
Name: Joseph Johnson   MRN: [id number]    DOB: 1961-09-21   Date:12/15/2022       Progress Note  Subjective  Chief Complaint  Follow Up  HPI  DMII: He saw Dr. Gabriel Carina in June of 2019 but because of cost of seeing sub-specialist  and A1C has improved he is back here for DM management.  A1C was 6.8% ,7.4%, 6.8% and it went up to 8 % Spring 2021 it went up to 8.8 % , 7.9 % 7.5 %, 6 % and switched from Xultophy to [place] A1C  6.6 %.it went up to 7.6 % down  6.6 % and today is 6.8 %    He denies polyphagia, polydipsia or polyuria. He is on statin therapy, ACE for kidney and also takes Cialis for ED. He started developed a rash to the adhesive of Dexcom sensor about 4 months ago, he finally stopped using the sensor about one month ago.  Discussed Dexcom G7. He states he skips lunch and is taking [place] different times of the afternoon, advised to take [place] before breakfast since he always eats breakfast He skips his oral medications a few times a week.   HTN: he  has been compliant with medication, back on Lotrel. No chest pain , dizziness or palpitation  BP has been 123XX123 systolic, he skips doses of medication   Hyperlipidemia: lipid panel had going up from 70 to 100, he has been eating fried food more often He is not very compliant with statin therapy.   ED: he states Cialis prn and it works well for him.  GERD: taking Omeprazole and states no heartburn or indigestion lately.   Herpes Genitalis: he started to have outbreaks in his 20's, he states last episode was over two years ago, he had an episodes April 2021 but nothing since, he does not take suppressive medication since married and episodes are infrequent Unchanged   Patient Active Problem List   Diagnosis Date Noted   Type 2 diabetes mellitus with diabetic neuropathy, with long-term current use of insulin ([place]) 09/14/2022   Gastroesophageal reflux disease without esophagitis 09/14/2022   Hypertension complicating diabetes ([place])  09/14/2022   Dyslipidemia associated with type 2 diabetes mellitus ([place]) 09/14/2022   Type 2 diabetes mellitus with hyperglycemia, with long-term current use of insulin ([place]) 03/11/2018   Allergic rhinitis, seasonal 03/03/2016   ED (erectile dysfunction) 06/21/2015   Type II diabetes mellitus with neuropathy causing erectile dysfunction ([place]) 06/20/2015   Hypercholesteremia 06/20/2015   Benign essential HTN 02/02/2008    No past surgical history on file.  Family History  Problem Relation Age of Onset   Hypertension Mother    Diabetes Father    Diabetes Brother    Hypercalcemia Brother    Hypertension Brother     Social History   Tobacco Use   Smoking status: Former    Types: Cigarettes    Quit date: 09/19/1990    Years since quitting: 32.2   Smokeless tobacco: Never   Tobacco comments:    more than 20 years ago  Substance Use Topics   Alcohol use: No    Alcohol/week: 0.0 standard drinks of alcohol     Current Outpatient Medications:    ACCU-CHEK GUIDE test strip, 1 EACH BY OTHER ROUTE 4 (FOUR) TIMES DAILY - BEFORE MEALS AND AT BEDTIME. USE AS INSTRUCTED, Disp: 400 strip, Rfl: 3   amLODipine-benazepril (LOTREL) 5-40 MG capsule, Take 1 capsule by mouth daily., Disp: 90 capsule, Rfl: 1  aspirin 81 MG tablet, , Disp: , Rfl:    atorvastatin (LIPITOR) 80 MG tablet, Take 1 tablet (80 mg total) by mouth daily., Disp: 90 tablet, Rfl: 1   empagliflozin (JARDIANCE) 25 MG TABS tablet, Take 1 tablet (25 mg total) by mouth daily., Disp: 90 tablet, Rfl: 1   fluticasone (FLONASE) 50 MCG/ACT nasal spray, Place 2 sprays into both nostrils daily., Disp: 48 g, Rfl: 0   Glucagon (GVOKE HYPOPEN 1-PACK) 1 MG/0.2ML SOAJ, Inject 1 each into the skin daily as needed. Severe hypoglycemia, Disp: 0.2 mL, Rfl: 1   insulin aspart (NOVOLOG FLEXPEN) 100 UNIT/ML FlexPen, INJECT 3-15 UNITS INTO THE SKIN 3 (THREE) TIMES DAILY WITH MEALS., Disp: 45 mL, Rfl: 1   Insulin Glargine-Lixisenatide (SOLIQUA)  100-33 UNT-MCG/ML SOPN, Inject 60 Units into the skin daily at 12 noon., Disp: 60 mL, Rfl: 1   Insulin Pen Needle (BD PEN NEEDLE MICRO U/F) 32G X 6 MM MISC, Inject 1 each into the skin 4 (four) times daily., Disp: 150 each, Rfl: 1   levocetirizine (XYZAL) 5 MG tablet, TAKE 1 TABLET (5 MG TOTAL) BY MOUTH EVERY EVENING., Disp: 90 tablet, Rfl: 0   omeprazole (PRILOSEC) 40 MG capsule, Take 1 capsule (40 mg total) by mouth daily., Disp: 90 capsule, Rfl: 1   tadalafil (CIALIS) 20 MG tablet, Take 1 tablet (20 mg total) by mouth every 3 (three) days., Disp: 30 tablet, Rfl: 0   triamcinolone cream (KENALOG) 0.1 %, Apply 1 application. topically 2 (two) times daily., Disp: 45 g, Rfl: 0   valACYclovir (VALTREX) 500 MG tablet, Take 1 tablet (500 mg total) by mouth 3 (three) times daily., Disp: 30 tablet, Rfl: 2  No Known Allergies  I personally reviewed active problem list, medication list, allergies, family history, social history, health maintenance with the patient/caregiver today.   ROS  ***  Objective  There were no vitals filed for this visit.  There is no height or weight on file to calculate BMI.  Physical Exam ***  No results found for this or any previous visit (from the past 2160 hour(s)).   PHQ2/9:    09/14/2022    8:22 AM 06/12/2022    8:00 AM 03/04/2022    8:21 AM 12/05/2021    8:04 AM 09/03/2021    7:31 AM  Depression screen PHQ 2/9  Decreased Interest 0 0 0 0 0  Down, Depressed, Hopeless 0 0 0 0 0  PHQ - 2 Score 0 0 0 0 0  Altered sleeping 0 0 0 0   Tired, decreased energy 0 0 0 0   Change in appetite 0 0 0 0   Feeling bad or failure about yourself  0 0 0 0   Trouble concentrating 0 0 0 0   Moving slowly or fidgety/restless 0 0 0 0   Suicidal thoughts 0 0 0 0   PHQ-9 Score 0 0 0 0     phq 9 is {gen pos [id number]   Fall Risk:    09/14/2022    8:22 AM 06/12/2022    8:00 AM 03/04/2022    8:20 AM 12/05/2021    8:03 AM 09/03/2021    7:31 AM  Fall Risk   Falls in  the past year? 0 0 0 0 0  Number falls in past yr: 0 0 0 0 0  Injury with Fall? 0 0 0 0 0  Risk for fall due to : No Fall Risks No Fall Risks No Fall Risks No Fall  Risks No Fall Risks  Follow up Falls prevention discussed Falls prevention discussed Falls prevention discussed Falls prevention discussed Falls prevention discussed      Functional Status Survey:      Assessment & Plan  *** There are no diagnoses linked to this encounter.

## 2022-12-16 ENCOUNTER — Ambulatory Visit: Payer: 59 | Admitting: Family Medicine

## 2023-02-26 ENCOUNTER — Other Ambulatory Visit: Payer: Self-pay | Admitting: Family Medicine

## 2023-02-26 ENCOUNTER — Encounter: Payer: Self-pay | Admitting: Family Medicine

## 2023-02-26 ENCOUNTER — Ambulatory Visit: Payer: 59 | Admitting: Family Medicine

## 2023-02-26 VITALS — BP 146/80 | HR 96 | Temp 98.2°F | Resp 16 | Ht 71.0 in | Wt 212.9 lb

## 2023-02-26 DIAGNOSIS — E1140 Type 2 diabetes mellitus with diabetic neuropathy, unspecified: Secondary | ICD-10-CM

## 2023-02-26 DIAGNOSIS — K219 Gastro-esophageal reflux disease without esophagitis: Secondary | ICD-10-CM

## 2023-02-26 DIAGNOSIS — Z794 Long term (current) use of insulin: Secondary | ICD-10-CM

## 2023-02-26 DIAGNOSIS — E1169 Type 2 diabetes mellitus with other specified complication: Secondary | ICD-10-CM

## 2023-02-26 DIAGNOSIS — E7800 Pure hypercholesterolemia, unspecified: Secondary | ICD-10-CM

## 2023-02-26 DIAGNOSIS — E785 Hyperlipidemia, unspecified: Secondary | ICD-10-CM

## 2023-02-26 DIAGNOSIS — I10 Essential (primary) hypertension: Secondary | ICD-10-CM | POA: Diagnosis not present

## 2023-02-26 LAB — POCT GLYCOSYLATED HEMOGLOBIN (HGB A1C): Hemoglobin A1C: 7.2 % — AB (ref 4.0–5.6)

## 2023-02-26 MED ORDER — BD PEN NEEDLE MICRO U/F 32G X 6 MM MISC: 1.0000 | Freq: Four times a day (QID) | 1 refills | Status: DC

## 2023-02-26 MED ORDER — AMLODIPINE BESY-BENAZEPRIL HCL 5-40 MG PO CAPS: 1.0000 | ORAL_CAPSULE | Freq: Every day | ORAL | 1 refills | Status: DC

## 2023-02-26 MED ORDER — TADALAFIL 20 MG PO TABS: 20.0000 mg | ORAL_TABLET | ORAL | 0 refills | Status: DC

## 2023-02-26 MED ORDER — OMEPRAZOLE 40 MG PO CPDR: 40.0000 mg | DELAYED_RELEASE_CAPSULE | Freq: Every day | ORAL | 1 refills | Status: DC

## 2023-02-26 MED ORDER — EMPAGLIFLOZIN 25 MG PO TABS: 25.0000 mg | ORAL_TABLET | Freq: Every day | ORAL | 1 refills | Status: DC

## 2023-02-26 MED ORDER — ATORVASTATIN CALCIUM 80 MG PO TABS: 80.0000 mg | ORAL_TABLET | Freq: Every day | ORAL | 1 refills | Status: DC

## 2023-02-26 NOTE — Progress Notes (Unsigned)
Name: Joseph Johnson   MRN: [id number]    DOB: 20-May-1961   Date:02/26/2023       Progress Note  Subjective  Chief Complaint  Follow up   HPI  DMII: He saw Dr. Gabriel Carina in June of 2019 but because of cost of seeing sub-specialist  and A1C has improved he is back here for DM management.  A1C has been stable, slightly up today from 6.8 % to 7.2 %    He denies polyphagia, polydipsia or polyuria. He is on statin therapy, ACE for kidney and also takes Cialis for ED. He started developed a rash to the adhesive of Dexcom sensor so he stopped using it since last Summer  He has been compliant with medication, fasting levels above 120 and advised him to titrate dose of Soliqua up to 60 units. He states not as compliant with his diet due to frequent trips for work to [place]   HTN: he  has been compliant with medication, back on Lotrel. No chest pain , dizziness or palpitation  BP is elevated today he states did not take his bp medication to [facility] this week , took one dose this morning after he came back   Hyperlipidemia: lipid panel had going up from 70 to 100, he will return to recheck fasting labs   ED: he states Cialis prn and it works well for him.He needs a refill today   GERD: taking Omeprazole and states no heartburn or indigestion lately.   Herpes Genitalis: he started to have outbreaks in his 20's, he states last episode was over two years ago, he had an episodes April 2021 but nothing since, he does not take suppressive medication since married and episodes are infrequent Unchanged   Patient Active Problem List   Diagnosis Date Noted   Type 2 diabetes mellitus with diabetic neuropathy, with long-term current use of insulin ([place]) 09/14/2022   Gastroesophageal reflux disease without esophagitis 09/14/2022   Hypertension complicating diabetes ([place]) 09/14/2022   Dyslipidemia associated with type 2 diabetes mellitus ([place]) 09/14/2022   Allergic rhinitis, seasonal 03/03/2016    Hypercholesteremia 06/20/2015   Benign essential HTN 02/02/2008    History reviewed. No pertinent surgical history.  Family History  Problem Relation Age of Onset   Hypertension Mother    Diabetes Father    Diabetes Brother    Hypercalcemia Brother    Hypertension Brother     Social History   Tobacco Use   Smoking status: Former    Types: Cigarettes    Quit date: 09/19/1990    Years since quitting: 32.4   Smokeless tobacco: Never   Tobacco comments:    more than 20 years ago  Substance Use Topics   Alcohol use: No    Alcohol/week: 0.0 standard drinks of alcohol     Current Outpatient Medications:    ACCU-CHEK GUIDE test strip, 1 EACH BY OTHER ROUTE 4 (FOUR) TIMES DAILY - BEFORE MEALS AND AT BEDTIME. USE AS INSTRUCTED, Disp: 400 strip, Rfl: 3   aspirin 81 MG tablet, , Disp: , Rfl:    insulin aspart (NOVOLOG FLEXPEN) 100 UNIT/ML FlexPen, INJECT 3-15 UNITS INTO THE SKIN 3 (THREE) TIMES DAILY WITH MEALS., Disp: 45 mL, Rfl: 1   Insulin Glargine-Lixisenatide (SOLIQUA) 100-33 UNT-MCG/ML SOPN, Inject 60 Units into the skin daily at 12 noon., Disp: 60 mL, Rfl: 1   levocetirizine (XYZAL) 5 MG tablet, TAKE 1 TABLET (5 MG TOTAL) BY MOUTH EVERY EVENING., Disp: 90 tablet, Rfl: 0   valACYclovir (  VALTREX) 500 MG tablet, Take 1 tablet (500 mg total) by mouth 3 (three) times daily., Disp: 30 tablet, Rfl: 2   amLODipine-benazepril (LOTREL) 5-40 MG capsule, Take 1 capsule by mouth daily., Disp: 90 capsule, Rfl: 1   atorvastatin (LIPITOR) 80 MG tablet, Take 1 tablet (80 mg total) by mouth daily., Disp: 90 tablet, Rfl: 1   empagliflozin (JARDIANCE) 25 MG TABS tablet, Take 1 tablet (25 mg total) by mouth daily., Disp: 90 tablet, Rfl: 1   Glucagon (GVOKE HYPOPEN 1-PACK) 1 MG/0.2ML SOAJ, Inject 1 each into the skin daily as needed. Severe hypoglycemia (Patient not taking: Reported on 02/26/2023), Disp: 0.2 mL, Rfl: 1   Insulin Pen Needle (BD PEN NEEDLE MICRO U/F) 32G X 6 MM MISC, Inject 1 each into  the skin 4 (four) times daily., Disp: 150 each, Rfl: 1   omeprazole (PRILOSEC) 40 MG capsule, Take 1 capsule (40 mg total) by mouth daily., Disp: 90 capsule, Rfl: 1   tadalafil (CIALIS) 20 MG tablet, Take 1 tablet (20 mg total) by mouth every 3 (three) days., Disp: 30 tablet, Rfl: 0  No Known Allergies  I personally reviewed {Reviewed:[id number]} with the patient/caregiver today.   ROS  ***  Objective  Vitals:   02/26/23 1549  BP: (!) 146/80  Pulse: 96  Resp: 16  Temp: 98.2 F (36.8 C)  TempSrc: Oral  SpO2: 98%  Weight: 212 lb 14.4 oz (96.6 kg)  Height: 5\' 11"  (1.803 m)    Body mass index is 29.69 kg/m.  Physical Exam  Constitutional: Patient appears well-developed and well-nourished. Obese *** No distress.  HEENT: head atraumatic, normocephalic, pupils equal and reactive to light, ears ***, neck supple, throat within normal limits Cardiovascular: Normal rate, regular rhythm and normal heart sounds.  No murmur heard. No BLE edema. Pulmonary/Chest: Effort normal and breath sounds normal. No respiratory distress. Abdominal: Soft.  There is no tenderness. Psychiatric: Patient has a normal mood and affect. behavior is normal. Judgment and thought content normal.   PHQ2/9:    02/26/2023    3:52 PM 09/14/2022    8:22 AM 06/12/2022    8:00 AM 03/04/2022    8:21 AM 12/05/2021    8:04 AM  Depression screen PHQ 2/9  Decreased Interest 0 0 0 0 0  Down, Depressed, Hopeless 0 0 0 0 0  PHQ - 2 Score 0 0 0 0 0  Altered sleeping 0 0 0 0 0  Tired, decreased energy 0 0 0 0 0  Change in appetite 0 0 0 0 0  Feeling bad or failure about yourself  0 0 0 0 0  Trouble concentrating 0 0 0 0 0  Moving slowly or fidgety/restless 0 0 0 0 0  Suicidal thoughts 0 0 0 0 0  PHQ-9 Score 0 0 0 0 0    phq 9 is negative   Fall Risk:    02/26/2023    3:52 PM 09/14/2022    8:22 AM 06/12/2022    8:00 AM 03/04/2022    8:20 AM 12/05/2021    8:03 AM  Fall Risk   Falls in the past year? 0 0 0 0 0   Number falls in past yr:  0 0 0 0  Injury with Fall?  0 0 0 0  Risk for fall due to : No Fall Risks No Fall Risks No Fall Risks No Fall Risks No Fall Risks  Follow up Falls prevention discussed Falls prevention discussed Falls prevention discussed Falls prevention discussed  Falls prevention discussed    Assessment & Plan  1. Dyslipidemia associated with type 2 diabetes mellitus (HCC)  - POCT HgB A1C - Lipid panel - Microalbumin / creatinine urine ratio - COMPLETE METABOLIC PANEL WITH GFR - amLODipine-benazepril (LOTREL) 5-40 MG capsule; Take 1 capsule by mouth daily.  Dispense: 90 capsule; Refill: 1 - atorvastatin (LIPITOR) 80 MG tablet; Take 1 tablet (80 mg total) by mouth daily.  Dispense: 90 tablet; Refill: 1 - empagliflozin (JARDIANCE) 25 MG TABS tablet; Take 1 tablet (25 mg total) by mouth daily.  Dispense: 90 tablet; Refill: 1  2. Type 2 diabetes mellitus with diabetic neuropathy, with long-term current use of insulin (HCC)  - POCT HgB A1C - empagliflozin (JARDIANCE) 25 MG TABS tablet; Take 1 tablet (25 mg total) by mouth daily.  Dispense: 90 tablet; Refill: 1 - Insulin Pen Needle (BD PEN NEEDLE MICRO U/F) 32G X 6 MM MISC; Inject 1 each into the skin 4 (four) times daily.  Dispense: 150 each; Refill: 1 - tadalafil (CIALIS) 20 MG tablet; Take 1 tablet (20 mg total) by mouth every 3 (three) days.  Dispense: 30 tablet; Refill: 0  3. Gastroesophageal reflux disease without esophagitis  - omeprazole (PRILOSEC) 40 MG capsule; Take 1 capsule (40 mg total) by mouth daily.  Dispense: 90 capsule; Refill: 1  4. Benign essential HTN  - COMPLETE METABOLIC PANEL WITH GFR - CBC with Differential/Platelet - amLODipine-benazepril (LOTREL) 5-40 MG capsule; Take 1 capsule by mouth daily.  Dispense: 90 capsule; Refill: 1  5. Hypercholesteremia  - atorvastatin (LIPITOR) 80 MG tablet; Take 1 tablet (80 mg total) by mouth daily.  Dispense: 90 tablet; Refill: 1

## 2023-02-26 NOTE — Telephone Encounter (Signed)
Appt made

## 2023-03-31 ENCOUNTER — Ambulatory Visit: Payer: 59 | Admitting: Family Medicine

## 2023-04-27 ENCOUNTER — Other Ambulatory Visit: Payer: Self-pay | Admitting: Family Medicine

## 2023-04-27 DIAGNOSIS — E1140 Type 2 diabetes mellitus with diabetic neuropathy, unspecified: Secondary | ICD-10-CM

## 2023-04-27 DIAGNOSIS — E1169 Type 2 diabetes mellitus with other specified complication: Secondary | ICD-10-CM

## 2023-05-27 NOTE — Progress Notes (Deleted)
Name: Joseph Johnson   MRN: [id number]    DOB: 1961/11/23   Date:05/27/2023       Progress Note  Subjective  Chief Complaint  Follow Up  HPI  DMII: He saw Dr. Tedd Sias in June of 2019 but because of cost of seeing sub-specialist  and A1C has improved he is back here for DM management.  A1C has been stable, slightly up today from 6.8 % to 7.2 %    He denies polyphagia, polydipsia or polyuria. He is on statin therapy, ACE for kidney and also takes Cialis for ED. He started developed a rash to the adhesive of Dexcom sensor so he stopped using it since last Summer  He has been compliant with medication, fasting levels above 120 and advised him to titrate dose of Soliqua up to 60 units. He states not as compliant with his diet due to frequent trips for work to [place]   HTN: he  has been compliant with medication, back on Lotrel. No chest pain , dizziness or palpitation  BP is elevated today he states did not take his bp medication to [facility] this week , took one dose this morning after he came back   Hyperlipidemia: lipid panel had going up from 70 to 100, he will return to recheck fasting labs   ED: he states Cialis prn and it works well for him.He needs a refill today   GERD: taking Omeprazole and states no heartburn or indigestion lately.   Herpes Genitalis: he started to have outbreaks in his 20's, he states last episode was over two years ago, he had an episodes April 2021 but nothing since, he does not take suppressive medication since married and episodes are infrequent Unchanged   Patient Active Problem List   Diagnosis Date Noted   Type 2 diabetes mellitus with diabetic neuropathy, with long-term current use of insulin ([facility]) 09/14/2022   Gastroesophageal reflux disease without esophagitis 09/14/2022   Hypertension complicating diabetes ([facility]) 09/14/2022   Dyslipidemia associated with type 2 diabetes mellitus ([facility]) 09/14/2022   Allergic rhinitis, seasonal 03/03/2016    Hypercholesteremia 06/20/2015   Benign essential HTN 02/02/2008    No past surgical history on file.  Family History  Problem Relation Age of Onset   Hypertension Mother    Diabetes Father    Diabetes Brother    Hypercalcemia Brother    Hypertension Brother     Social History   Tobacco Use   Smoking status: Former    Types: Cigarettes    Quit date: 09/19/1990    Years since quitting: 32.7   Smokeless tobacco: Never   Tobacco comments:    more than 20 years ago  Substance Use Topics   Alcohol use: No    Alcohol/week: 0.0 standard drinks of alcohol     Current Outpatient Medications:    ACCU-CHEK GUIDE test strip, 1 EACH BY OTHER ROUTE 4 (FOUR) TIMES DAILY - BEFORE MEALS AND AT BEDTIME. USE AS INSTRUCTED, Disp: 400 strip, Rfl: 3   amLODipine-benazepril (LOTREL) 5-40 MG capsule, Take 1 capsule by mouth daily., Disp: 90 capsule, Rfl: 1   aspirin 81 MG tablet, , Disp: , Rfl:    atorvastatin (LIPITOR) 80 MG tablet, Take 1 tablet (80 mg total) by mouth daily., Disp: 90 tablet, Rfl: 1   empagliflozin (JARDIANCE) 25 MG TABS tablet, Take 1 tablet (25 mg total) by mouth daily., Disp: 90 tablet, Rfl: 1   Glucagon (GVOKE HYPOPEN 1-PACK) 1 MG/0.2ML SOAJ, Inject 1 each into the  skin daily as needed. Severe hypoglycemia (Patient not taking: Reported on 02/26/2023), Disp: 0.2 mL, Rfl: 1   insulin aspart (NOVOLOG FLEXPEN) 100 UNIT/ML FlexPen, INJECT 3-15 UNITS INTO THE SKIN 3 (THREE) TIMES DAILY WITH MEALS., Disp: 45 mL, Rfl: 1   Insulin Glargine-Lixisenatide (SOLIQUA) 100-33 UNT-MCG/ML SOPN, INJECT 60 UNITS INTO THE SKIN DAILY AT 12 NOON., Disp: 60 mL, Rfl: 0   Insulin Pen Needle (BD PEN NEEDLE MICRO U/F) 32G X 6 MM MISC, INJECT 1 EACH INTO THE SKIN 4 (FOUR) TIMES DAILY., Disp: 300 each, Rfl: 1   levocetirizine (XYZAL) 5 MG tablet, TAKE 1 TABLET (5 MG TOTAL) BY MOUTH EVERY EVENING., Disp: 90 tablet, Rfl: 0   omeprazole (PRILOSEC) 40 MG capsule, Take 1 capsule (40 mg total) by mouth daily.,  Disp: 90 capsule, Rfl: 1   tadalafil (CIALIS) 20 MG tablet, Take 1 tablet (20 mg total) by mouth every 3 (three) days., Disp: 30 tablet, Rfl: 0   valACYclovir (VALTREX) 500 MG tablet, Take 1 tablet (500 mg total) by mouth 3 (three) times daily., Disp: 30 tablet, Rfl: 2  No Known Allergies  I personally reviewed active problem list, medication list, allergies, family history, social history, health maintenance with the patient/caregiver today.   ROS  ***  Objective  There were no vitals filed for this visit.  There is no height or weight on file to calculate BMI.  Physical Exam ***  Recent Results (from the past 2160 hour(s))  POCT HgB A1C     Status: Abnormal   Collection Time: 02/26/23  3:53 PM  Result Value Ref Range   Hemoglobin A1C 7.2 (A) 4.0 - 5.6 %   HbA1c POC (<> result, manual entry)     HbA1c, POC (prediabetic range)     HbA1c, POC (controlled diabetic range)      PHQ2/9:    02/26/2023    3:52 PM 09/14/2022    8:22 AM 06/12/2022    8:00 AM 03/04/2022    8:21 AM 12/05/2021    8:04 AM  Depression screen PHQ 2/9  Decreased Interest 0 0 0 0 0  Down, Depressed, Hopeless 0 0 0 0 0  PHQ - 2 Score 0 0 0 0 0  Altered sleeping 0 0 0 0 0  Tired, decreased energy 0 0 0 0 0  Change in appetite 0 0 0 0 0  Feeling bad or failure about yourself  0 0 0 0 0  Trouble concentrating 0 0 0 0 0  Moving slowly or fidgety/restless 0 0 0 0 0  Suicidal thoughts 0 0 0 0 0  PHQ-9 Score 0 0 0 0 0    phq 9 is {gen pos [id number]}   Fall Risk:    02/26/2023    3:52 PM 09/14/2022    8:22 AM 06/12/2022    8:00 AM 03/04/2022    8:20 AM 12/05/2021    8:03 AM  Fall Risk   Falls in the past year? 0 0 0 0 0  Number falls in past yr:  0 0 0 0  Injury with Fall?  0 0 0 0  Risk for fall due to : No Fall Risks No Fall Risks No Fall Risks No Fall Risks No Fall Risks  Follow up Falls prevention discussed Falls prevention discussed Falls prevention discussed Falls prevention discussed Falls  prevention discussed      Functional Status Survey:      Assessment & Plan  *** There are no diagnoses linked to  this encounter.

## 2023-05-28 ENCOUNTER — Ambulatory Visit: Payer: 59 | Admitting: Family Medicine

## 2023-05-31 ENCOUNTER — Ambulatory Visit: Payer: 59 | Admitting: Family Medicine

## 2023-05-31 DIAGNOSIS — E1169 Type 2 diabetes mellitus with other specified complication: Secondary | ICD-10-CM

## 2023-07-22 ENCOUNTER — Other Ambulatory Visit: Payer: Self-pay | Admitting: Family Medicine

## 2023-07-22 DIAGNOSIS — E785 Hyperlipidemia, unspecified: Secondary | ICD-10-CM

## 2023-08-10 ENCOUNTER — Telehealth: Payer: Self-pay | Admitting: Family Medicine

## 2023-08-10 NOTE — Telephone Encounter (Signed)
Pt is calling in because he set an appointment for 10/08/23 at 3PM and wanted to see his PCP specifically. Pt wants to know will Dr. Carlynn Purl be able to refill his medications until the appointment? PT says he has about 2 weeks left on his Novolog Prescription and wants to be sure he will be able to get his meds. Please advise.

## 2023-08-10 NOTE — Telephone Encounter (Signed)
Appt sch'd for Sept 5. Pt aware of appt change

## 2023-08-12 NOTE — Progress Notes (Unsigned)
Name: Joseph Johnson   MRN: [id number]    DOB: July 23, 1961   Date:08/15/2023       Progress Note  Subjective  Chief Complaint  Follow Up  HPI  DMII: A1C is trending up again, he has not been compliant with his diet and has not been checking his glucose on a regular basis.  He denies polyphagia, polydipsia or polyuria. He is on statin therapy, ACE for kidney and also takes Cialis for ED. He started developed a rash to the adhesive of Dexcom sensor so he stopped using it since Summer 2023. A1C today was. Discussed importance of resuming a diabetic diet and also importance of being more consistent with pre meal insulin, try to use it 15-20 minutes before meals. He also needs to use sliding scale insulin  HTN: he  has been compliant with medication, back on Lotrel. No chest pain , dizziness or palpitation  BP is at goal today. We will recheck labs   Hyperlipidemia: lipid panel had going up from 70 to 100, he  will have labs done today   ED: he states Cialis prn and it works well for him.  GERD: taking Omeprazole and is doing well    Patient Active Problem List   Diagnosis Date Noted   Type 2 diabetes mellitus with diabetic neuropathy, with long-term current use of insulin (HCC) 09/14/2022   Gastroesophageal reflux disease without esophagitis 09/14/2022   Hypertension complicating diabetes (HCC) 09/14/2022   Dyslipidemia associated with type 2 diabetes mellitus (HCC) 09/14/2022   Allergic rhinitis, seasonal 03/03/2016   Hypercholesteremia 06/20/2015   Benign essential HTN 02/02/2008    No past surgical history on file.  Family History  Problem Relation Age of Onset   Hypertension Mother    Diabetes Father    Diabetes Brother    Hypercalcemia Brother    Hypertension Brother     Social History   Tobacco Use   Smoking status: Former    Current packs/day: 0.00    Types: Cigarettes    Quit date: 09/19/1990    Years since quitting: 32.9   Smokeless tobacco: Never   Tobacco  comments:    more than 20 years ago  Substance Use Topics   Alcohol use: No    Alcohol/week: 0.0 standard drinks of alcohol     Current Outpatient Medications:    ACCU-CHEK GUIDE test strip, 1 EACH BY OTHER ROUTE 4 (FOUR) TIMES DAILY - BEFORE MEALS AND AT BEDTIME. USE AS INSTRUCTED, Disp: 400 strip, Rfl: 3   aspirin 81 MG tablet, , Disp: , Rfl:    Glucagon (GVOKE HYPOPEN 1-PACK) 1 MG/0.2ML SOAJ, Inject 1 each into the skin daily as needed. Severe hypoglycemia, Disp: 0.2 mL, Rfl: 1   levocetirizine (XYZAL) 5 MG tablet, TAKE 1 TABLET (5 MG TOTAL) BY MOUTH EVERY EVENING., Disp: 90 tablet, Rfl: 0   valACYclovir (VALTREX) 500 MG tablet, Take 1 tablet (500 mg total) by mouth 3 (three) times daily., Disp: 30 tablet, Rfl: 2   amLODipine-benazepril (LOTREL) 5-40 MG capsule, Take 1 capsule by mouth daily., Disp: 90 capsule, Rfl: 1   atorvastatin (LIPITOR) 80 MG tablet, Take 1 tablet (80 mg total) by mouth daily., Disp: 90 tablet, Rfl: 1   empagliflozin (JARDIANCE) 25 MG TABS tablet, Take 1 tablet (25 mg total) by mouth daily., Disp: 90 tablet, Rfl: 1   insulin aspart (NOVOLOG FLEXPEN) 100 UNIT/ML FlexPen, Inject 3-15 Units into the skin 3 (three) times daily with meals., Disp: 45 mL, Rfl: 1  Insulin Glargine-Lixisenatide (SOLIQUA) 100-33 UNT-MCG/ML SOPN, Inject 60 Units into the skin daily at 12 noon., Disp: 60 mL, Rfl: 1   Insulin Pen Needle (BD PEN NEEDLE MICRO U/F) 32G X 6 MM MISC, Inject 1 each into the skin 4 (four) times daily., Disp: 300 each, Rfl: 1   omeprazole (PRILOSEC) 40 MG capsule, Take 1 capsule (40 mg total) by mouth daily., Disp: 90 capsule, Rfl: 1   tadalafil (CIALIS) 20 MG tablet, Take 1 tablet (20 mg total) by mouth every 3 (three) days., Disp: 90 tablet, Rfl: 0  No Known Allergies  I personally reviewed active problem list, medication list, allergies, family history, social history, health maintenance with the patient/caregiver today.   ROS  Ten systems reviewed and is  negative except as mentioned in HPI    Objective  Vitals:   08/13/23 1419  BP: 132/86  Pulse: 76  Resp: 16  Weight: 209 lb (94.8 kg)  Height: 5\' 10"  (1.778 m)    Body mass index is 29.99 kg/m.  Physical Exam  Constitutional: Patient appears well-developed and well-nourished. No distress.  HEENT: head atraumatic, normocephalic, pupils equal, neck supple, throat within normal limits Cardiovascular: Normal rate, regular rhythm and normal heart sounds.  No murmur heard. No BLE edema. Pulmonary/Chest: Effort normal and breath sounds normal. No respiratory distress. Abdominal: Soft.  There is no tenderness. Psychiatric: Patient has a normal mood and affect. behavior is normal. Judgment and thought content normal.   Recent Results (from the past 2160 hour(s))  POCT HgB A1C     Status: Abnormal   Collection Time: 08/13/23  2:20 PM  Result Value Ref Range   Hemoglobin A1C 8.2 (A) 4.0 - 5.6 %   HbA1c POC (<> result, manual entry)     HbA1c, POC (prediabetic range)     HbA1c, POC (controlled diabetic range)    Lipid panel     Status: Abnormal   Collection Time: 08/13/23  3:15 PM  Result Value Ref Range   Cholesterol 209 (H) <200 mg/dL   HDL 54 > OR = 40 mg/dL   Triglycerides 952 (H) <150 mg/dL   LDL Cholesterol (Calc) 128 (H) mg/dL (calc)    Comment: Reference range: <100 . Desirable range <100 mg/dL for primary prevention;   <70 mg/dL for patients with CHD or diabetic patients  with > or = 2 CHD risk factors. Marland Kitchen LDL-C is now calculated using the Martin-Hopkins  calculation, which is a validated novel method providing  better accuracy than the Friedewald equation in the  estimation of LDL-C.  Horald Pollen et al. Lenox Ahr. 8413;244(01): 2061-2068  (http://education.[facility].com/faq/FAQ164)    Total CHOL/HDL Ratio 3.9 <5.0 (calc)   Non-HDL Cholesterol (Calc) 155 (H) <130 mg/dL (calc)    Comment: For patients with diabetes plus 1 major ASCVD risk  factor, treating to a  non-HDL-C goal of <100 mg/dL  (LDL-C of <02 mg/dL) is considered a therapeutic  option.   Microalbumin / creatinine urine ratio     Status: None   Collection Time: 08/13/23  3:15 PM  Result Value Ref Range   Creatinine, Urine 222 20 - 320 mg/dL   Microalb, Ur 1.0 mg/dL    Comment: Reference Range Not established    Microalb Creat Ratio 5 <30 mg/g creat    Comment: . The ADA defines abnormalities in albumin excretion as follows: Marland Kitchen Albuminuria Category        Result (mg/g creatinine) . Normal to Mildly increased   <30 Moderately increased  30-299  Severely increased           > OR = 300 . The ADA recommends that at least two of three specimens collected within a 3-6 month period be abnormal before considering a patient to be within a diagnostic category.   COMPLETE METABOLIC PANEL WITH GFR     Status: Abnormal   Collection Time: 08/13/23  3:15 PM  Result Value Ref Range   Glucose, Bld 112 (H) 65 - 99 mg/dL    Comment: .            Fasting reference interval . For someone without known diabetes, a glucose value between 100 and 125 mg/dL is consistent with prediabetes and should be confirmed with a follow-up test. .    BUN 13 7 - 25 mg/dL   Creat 1.19 1.47 - 8.29 mg/dL   eGFR 90 > OR = 60 FA/OZH/0.86V7   BUN/Creatinine Ratio SEE NOTE: 6 - 22 (calc)    Comment:    Not Reported: BUN and Creatinine are within    reference range. .    Sodium 141 135 - 146 mmol/L   Potassium 4.0 3.5 - 5.3 mmol/L   Chloride 103 98 - 110 mmol/L   CO2 29 20 - 32 mmol/L   Calcium 9.7 8.6 - 10.3 mg/dL   Total Protein 7.0 6.1 - 8.1 g/dL   Albumin 4.3 3.6 - 5.1 g/dL   Globulin 2.7 1.9 - 3.7 g/dL (calc)   AG Ratio 1.6 1.0 - 2.5 (calc)   Total Bilirubin 1.7 (H) 0.2 - 1.2 mg/dL   Alkaline phosphatase (APISO) 77 35 - 144 U/L   AST 18 10 - 35 U/L   ALT 17 9 - 46 U/L  CBC with Differential/Platelet     Status: Abnormal   Collection Time: 08/13/23  3:15 PM  Result Value Ref Range    WBC 4.6 3.8 - 10.8 Thousand/uL   RBC 5.29 4.20 - 5.80 Million/uL   Hemoglobin 15.6 13.2 - 17.1 g/dL   HCT 84.6 96.2 - 95.2 %   MCV 89.4 80.0 - 100.0 fL   MCH 29.5 27.0 - 33.0 pg   MCHC 33.0 32.0 - 36.0 g/dL   RDW 84.1 32.4 - 40.1 %   Platelets 175 140 - 400 Thousand/uL   MPV 13.6 (H) 7.5 - 12.5 fL   Neutro Abs 3,105 1,500 - 7,800 cells/uL   Lymphs Abs 1,145 850 - 3,900 cells/uL   Absolute Monocytes 271 200 - 950 cells/uL   Eosinophils Absolute 69 15 - 500 cells/uL   Basophils Absolute 9 0 - 200 cells/uL   Neutrophils Relative % 67.5 %   Total Lymphocyte 24.9 %   Monocytes Relative 5.9 %   Eosinophils Relative 1.5 %   Basophils Relative 0.2 %     PHQ2/9:    08/13/2023    2:20 PM 02/26/2023    3:52 PM 09/14/2022    8:22 AM 06/12/2022    8:00 AM 03/04/2022    8:21 AM  Depression screen PHQ 2/9  Decreased Interest 0 0 0 0 0  Down, Depressed, Hopeless 0 0 0 0 0  PHQ - 2 Score 0 0 0 0 0  Altered sleeping 0 0 0 0 0  Tired, decreased energy 0 0 0 0 0  Change in appetite 0 0 0 0 0  Feeling bad or failure about yourself  0 0 0 0 0  Trouble concentrating 0 0 0 0 0  Moving slowly or fidgety/restless  0 0 0 0 0  Suicidal thoughts 0 0 0 0 0  PHQ-9 Score 0 0 0 0 0    phq 9 is negative   Fall Risk:    08/13/2023    2:19 PM 02/26/2023    3:52 PM 09/14/2022    8:22 AM 06/12/2022    8:00 AM 03/04/2022    8:20 AM  Fall Risk   Falls in the past year? 0 0 0 0 0  Number falls in past yr: 0  0 0 0  Injury with Fall? 0  0 0 0  Risk for fall due to : No Fall Risks No Fall Risks No Fall Risks No Fall Risks No Fall Risks  Follow up Falls prevention discussed Falls prevention discussed Falls prevention discussed Falls prevention discussed Falls prevention discussed      Functional Status Survey: Is the patient deaf or have difficulty hearing?: No Does the patient have difficulty seeing, even when wearing glasses/contacts?: No Does the patient have difficulty concentrating, remembering, or  making decisions?: No Does the patient have difficulty walking or climbing stairs?: No Does the patient have difficulty dressing or bathing?: No Does the patient have difficulty doing errands alone such as visiting a doctor's office or shopping?: No  Diabetic Foot Exam - Simple   Simple Foot Form Visual Inspection No deformities, no ulcerations, no other skin breakdown bilaterally: Yes Sensation Testing Intact to touch and monofilament testing bilaterally: Yes Pulse Check Posterior Tibialis and Dorsalis pulse intact bilaterally: Yes Comments      Assessment & Plan  1. Dyslipidemia associated with type 2 diabetes mellitus (HCC)  - POCT HgB A1C - HM Diabetes Foot Exam - amLODipine-benazepril (LOTREL) 5-40 MG capsule; Take 1 capsule by mouth daily.  Dispense: 90 capsule; Refill: 1 - atorvastatin (LIPITOR) 80 MG tablet; Take 1 tablet (80 mg total) by mouth daily.  Dispense: 90 tablet; Refill: 1 - empagliflozin (JARDIANCE) 25 MG TABS tablet; Take 1 tablet (25 mg total) by mouth daily.  Dispense: 90 tablet; Refill: 1 - Insulin Glargine-Lixisenatide (SOLIQUA) 100-33 UNT-MCG/ML SOPN; Inject 60 Units into the skin daily at 12 noon.  Dispense: 60 mL; Refill: 1 - insulin aspart (NOVOLOG FLEXPEN) 100 UNIT/ML FlexPen; Inject 3-15 Units into the skin 3 (three) times daily with meals.  Dispense: 45 mL; Refill: 1 - Insulin Pen Needle (BD PEN NEEDLE MICRO U/F) 32G X 6 MM MISC; Inject 1 each into the skin 4 (four) times daily.  Dispense: 300 each; Refill: 1  2. Gastroesophageal reflux disease without esophagitis  - omeprazole (PRILOSEC) 40 MG capsule; Take 1 capsule (40 mg total) by mouth daily.  Dispense: 90 capsule; Refill: 1  3. Type 2 diabetes mellitus with diabetic neuropathy, with long-term current use of insulin (HCC)  - empagliflozin (JARDIANCE) 25 MG TABS tablet; Take 1 tablet (25 mg total) by mouth daily.  Dispense: 90 tablet; Refill: 1 - tadalafil (CIALIS) 20 MG tablet; Take 1 tablet  (20 mg total) by mouth every 3 (three) days.  Dispense: 90 tablet; Refill: 0 - Insulin Pen Needle (BD PEN NEEDLE MICRO U/F) 32G X 6 MM MISC; Inject 1 each into the skin 4 (four) times daily.  Dispense: 300 each; Refill: 1  4. Needs flu shot  - Flu vaccine trivalent PF, 6mos and older(Flulaval,Afluria,Fluarix,Fluzone)  5. Benign essential HTN  - amLODipine-benazepril (LOTREL) 5-40 MG capsule; Take 1 capsule by mouth daily.  Dispense: 90 capsule; Refill: 1  6. Hypercholesteremia  - atorvastatin (LIPITOR) 80 MG tablet; Take  1 tablet (80 mg total) by mouth daily.  Dispense: 90 tablet; Refill: 1

## 2023-08-13 ENCOUNTER — Encounter: Payer: Self-pay | Admitting: Family Medicine

## 2023-08-13 ENCOUNTER — Ambulatory Visit: Payer: 59 | Admitting: Family Medicine

## 2023-08-13 VITALS — BP 132/86 | HR 76 | Resp 16 | Ht 70.0 in | Wt 209.0 lb

## 2023-08-13 DIAGNOSIS — E1140 Type 2 diabetes mellitus with diabetic neuropathy, unspecified: Secondary | ICD-10-CM

## 2023-08-13 DIAGNOSIS — I10 Essential (primary) hypertension: Secondary | ICD-10-CM

## 2023-08-13 DIAGNOSIS — K219 Gastro-esophageal reflux disease without esophagitis: Secondary | ICD-10-CM

## 2023-08-13 DIAGNOSIS — E7800 Pure hypercholesterolemia, unspecified: Secondary | ICD-10-CM

## 2023-08-13 DIAGNOSIS — E785 Hyperlipidemia, unspecified: Secondary | ICD-10-CM | POA: Diagnosis not present

## 2023-08-13 DIAGNOSIS — Z794 Long term (current) use of insulin: Secondary | ICD-10-CM

## 2023-08-13 DIAGNOSIS — Z23 Encounter for immunization: Secondary | ICD-10-CM | POA: Diagnosis not present

## 2023-08-13 DIAGNOSIS — E1169 Type 2 diabetes mellitus with other specified complication: Secondary | ICD-10-CM | POA: Diagnosis not present

## 2023-08-13 LAB — POCT GLYCOSYLATED HEMOGLOBIN (HGB A1C): Hemoglobin A1C: 8.2 % — AB (ref 4.0–5.6)

## 2023-08-13 MED ORDER — ATORVASTATIN CALCIUM 80 MG PO TABS
80.0000 mg | ORAL_TABLET | Freq: Every day | ORAL | 1 refills | Status: DC
Start: 2023-08-13 — End: 2024-04-04

## 2023-08-13 MED ORDER — BD PEN NEEDLE MICRO U/F 32G X 6 MM MISC
1.0000 | Freq: Four times a day (QID) | 1 refills | Status: DC
Start: 2023-08-13 — End: 2024-02-28

## 2023-08-13 MED ORDER — NOVOLOG FLEXPEN 100 UNIT/ML ~~LOC~~ SOPN: 3.0000 [IU] | PEN_INJECTOR | Freq: Three times a day (TID) | SUBCUTANEOUS | 1 refills | Status: DC

## 2023-08-13 MED ORDER — AMLODIPINE BESY-BENAZEPRIL HCL 5-40 MG PO CAPS
1.0000 | ORAL_CAPSULE | Freq: Every day | ORAL | 1 refills | Status: DC
Start: 2023-08-13 — End: 2024-04-04

## 2023-08-13 MED ORDER — EMPAGLIFLOZIN 25 MG PO TABS
25.0000 mg | ORAL_TABLET | Freq: Every day | ORAL | 1 refills | Status: DC
Start: 2023-08-13 — End: 2024-04-04

## 2023-08-13 MED ORDER — OMEPRAZOLE 40 MG PO CPDR
40.0000 mg | DELAYED_RELEASE_CAPSULE | Freq: Every day | ORAL | 1 refills | Status: DC
Start: 2023-08-13 — End: 2024-04-04

## 2023-08-13 MED ORDER — SOLIQUA 100-33 UNT-MCG/ML ~~LOC~~ SOPN: 60.0000 [IU] | PEN_INJECTOR | Freq: Every day | SUBCUTANEOUS | 1 refills | Status: DC

## 2023-08-13 MED ORDER — TADALAFIL 20 MG PO TABS
20.0000 mg | ORAL_TABLET | ORAL | 0 refills | Status: DC
Start: 2023-08-13 — End: 2024-04-04

## 2023-08-13 NOTE — Patient Instructions (Signed)
Fasting glucose should be between 90-130  Pre-meal insulin :  Check fsbs prior to meals  Hold if below 110 If glucose 110- 140 give 4 units If glucose 140 to 180 give 6 units  If glucose 180 to 220  give 8 units If glucose above 220 give 10 units   Check glucose 2 hours later to see if glucose is staying below 180

## 2023-08-14 LAB — COMPLETE METABOLIC PANEL WITH GFR
AG Ratio: 1.6 (calc) (ref 1.0–2.5)
AST: 18 U/L (ref 10–35)
Creat: 0.97 mg/dL (ref 0.70–1.30)
Globulin: 2.7 g/dL (calc) (ref 1.9–3.7)
Potassium: 4 mmol/L (ref 3.5–5.3)
Sodium: 141 mmol/L (ref 135–146)
Total Bilirubin: 1.7 mg/dL — ABNORMAL HIGH (ref 0.2–1.2)

## 2023-08-14 LAB — CBC WITH DIFFERENTIAL/PLATELET: Neutro Abs: 3105 cells/uL (ref 1500–7800)

## 2023-08-15 ENCOUNTER — Encounter: Payer: Self-pay | Admitting: Family Medicine

## 2023-08-20 LAB — HM DIABETES EYE EXAM

## 2023-08-30 ENCOUNTER — Encounter: Payer: Self-pay | Admitting: Family Medicine

## 2023-08-30 ENCOUNTER — Other Ambulatory Visit: Payer: Self-pay

## 2023-08-30 DIAGNOSIS — E1140 Type 2 diabetes mellitus with diabetic neuropathy, unspecified: Secondary | ICD-10-CM

## 2023-08-30 MED ORDER — ACCU-CHEK GUIDE VI STRP: 1.0000 | ORAL_STRIP | Freq: Three times a day (TID) | 0 refills | Status: AC

## 2023-10-07 NOTE — Progress Notes (Deleted)
Name: Joseph Johnson   MRN: [id number]    DOB: Aug 10, 1961   Date:10/07/2023       Progress Note  Subjective  Chief Complaint  Medication Refill  HPI  DMII: A1C is trending up again, he has not been compliant with his diet and has not been checking his glucose on a regular basis.  He denies polyphagia, polydipsia or polyuria. He is on statin therapy, ACE for kidney and also takes Cialis for ED. He started developed a rash to the adhesive of Dexcom sensor so he stopped using it since Summer 2023. A1C today was. Discussed importance of resuming a diabetic diet and also importance of being more consistent with pre meal insulin, try to use it 15-20 minutes before meals. He also needs to use sliding scale insulin  HTN: he  has been compliant with medication, back on Lotrel. No chest pain , dizziness or palpitation  BP is at goal today. We will recheck labs   Hyperlipidemia: lipid panel had going up from 70 to 100, he  will have labs done today   ED: he states Cialis prn and it works well for him.  GERD: taking Omeprazole and is doing well   Patient Active Problem List   Diagnosis Date Noted   Type 2 diabetes mellitus with diabetic neuropathy, with long-term current use of insulin (HCC) 09/14/2022   Gastroesophageal reflux disease without esophagitis 09/14/2022   Hypertension complicating diabetes (HCC) 09/14/2022   Dyslipidemia associated with type 2 diabetes mellitus (HCC) 09/14/2022   Allergic rhinitis, seasonal 03/03/2016   Hypercholesteremia 06/20/2015   Benign essential HTN 02/02/2008    No past surgical history on file.  Family History  Problem Relation Age of Onset   Hypertension Mother    Diabetes Father    Diabetes Brother    Hypercalcemia Brother    Hypertension Brother     Social History   Tobacco Use   Smoking status: Former    Current packs/day: 0.00    Types: Cigarettes    Quit date: 09/19/1990    Years since quitting: 33.0   Smokeless tobacco: Never    Tobacco comments:    more than 20 years ago  Substance Use Topics   Alcohol use: No    Alcohol/week: 0.0 standard drinks of alcohol     Current Outpatient Medications:    ACCU-CHEK GUIDE test strip, 1 each by Other route 3 (three) times daily. Use as instructed, Disp: 300 strip, Rfl: 0   amLODipine-benazepril (LOTREL) 5-40 MG capsule, Take 1 capsule by mouth daily., Disp: 90 capsule, Rfl: 1   aspirin 81 MG tablet, , Disp: , Rfl:    atorvastatin (LIPITOR) 80 MG tablet, Take 1 tablet (80 mg total) by mouth daily., Disp: 90 tablet, Rfl: 1   empagliflozin (JARDIANCE) 25 MG TABS tablet, Take 1 tablet (25 mg total) by mouth daily., Disp: 90 tablet, Rfl: 1   Glucagon (GVOKE HYPOPEN 1-PACK) 1 MG/0.2ML SOAJ, Inject 1 each into the skin daily as needed. Severe hypoglycemia, Disp: 0.2 mL, Rfl: 1   insulin aspart (NOVOLOG FLEXPEN) 100 UNIT/ML FlexPen, Inject 3-15 Units into the skin 3 (three) times daily with meals., Disp: 45 mL, Rfl: 1   Insulin Glargine-Lixisenatide (SOLIQUA) 100-33 UNT-MCG/ML SOPN, Inject 60 Units into the skin daily at 12 noon., Disp: 60 mL, Rfl: 1   Insulin Pen Needle (BD PEN NEEDLE MICRO U/F) 32G X 6 MM MISC, Inject 1 each into the skin 4 (four) times daily., Disp: 300 each, Rfl: 1  levocetirizine (XYZAL) 5 MG tablet, TAKE 1 TABLET (5 MG TOTAL) BY MOUTH EVERY EVENING., Disp: 90 tablet, Rfl: 0   omeprazole (PRILOSEC) 40 MG capsule, Take 1 capsule (40 mg total) by mouth daily., Disp: 90 capsule, Rfl: 1   tadalafil (CIALIS) 20 MG tablet, Take 1 tablet (20 mg total) by mouth every 3 (three) days., Disp: 90 tablet, Rfl: 0   valACYclovir (VALTREX) 500 MG tablet, Take 1 tablet (500 mg total) by mouth 3 (three) times daily., Disp: 30 tablet, Rfl: 2  No Known Allergies  I personally reviewed active problem list, medication list, allergies, family history, social history, health maintenance with the patient/caregiver today.   ROS  ***  Objective  There were no vitals filed for  this visit.  There is no height or weight on file to calculate BMI.  Physical Exam ***   PHQ2/9:    08/13/2023    2:20 PM 02/26/2023    3:52 PM 09/14/2022    8:22 AM 06/12/2022    8:00 AM 03/04/2022    8:21 AM  Depression screen PHQ 2/9  Decreased Interest 0 0 0 0 0  Down, Depressed, Hopeless 0 0 0 0 0  PHQ - 2 Score 0 0 0 0 0  Altered sleeping 0 0 0 0 0  Tired, decreased energy 0 0 0 0 0  Change in appetite 0 0 0 0 0  Feeling bad or failure about yourself  0 0 0 0 0  Trouble concentrating 0 0 0 0 0  Moving slowly or fidgety/restless 0 0 0 0 0  Suicidal thoughts 0 0 0 0 0  PHQ-9 Score 0 0 0 0 0    phq 9 is {gen pos [id number]}   Fall Risk:    08/13/2023    2:19 PM 02/26/2023    3:52 PM 09/14/2022    8:22 AM 06/12/2022    8:00 AM 03/04/2022    8:20 AM  Fall Risk   Falls in the past year? 0 0 0 0 0  Number falls in past yr: 0  0 0 0  Injury with Fall? 0  0 0 0  Risk for fall due to : No Fall Risks No Fall Risks No Fall Risks No Fall Risks No Fall Risks  Follow up Falls prevention discussed Falls prevention discussed Falls prevention discussed Falls prevention discussed Falls prevention discussed      Functional Status Survey:      Assessment & Plan  *** There are no diagnoses linked to this encounter.

## 2023-10-08 ENCOUNTER — Ambulatory Visit: Payer: 59 | Admitting: Family Medicine

## 2023-10-08 NOTE — Progress Notes (Unsigned)
Name: Joseph Johnson   MRN: [id number]    DOB: 1961-07-24   Date:10/11/2023       Progress Note  Subjective  Chief Complaint  Medication Refill  HPI  DMII: A1C  was up to 8.2% one month ago, he has been more compliant taking medication and using pre meal insulin, eating better and glucose at home has been below 100 fasting, usually in the high 80's. Post prandially the highest was 260, usually around 190 . No polyphagia, polydipsia or polyuria. He has also been taking cholesterol medication more often, he would like to resume using Dexcom to have more accurate and frequent glucose readings.    Patient Active Problem List   Diagnosis Date Noted   Type 2 diabetes mellitus with diabetic neuropathy, with long-term current use of insulin (HCC) 09/14/2022   Gastroesophageal reflux disease without esophagitis 09/14/2022   Hypertension complicating diabetes (HCC) 09/14/2022   Dyslipidemia associated with type 2 diabetes mellitus (HCC) 09/14/2022   Allergic rhinitis, seasonal 03/03/2016   Hypercholesteremia 06/20/2015   Benign essential HTN 02/02/2008    No past surgical history on file.  Family History  Problem Relation Age of Onset   Hypertension Mother    Diabetes Father    Diabetes Brother    Hypercalcemia Brother    Hypertension Brother     Social History   Tobacco Use   Smoking status: Former    Current packs/day: 0.00    Types: Cigarettes    Quit date: 09/19/1990    Years since quitting: 33.0   Smokeless tobacco: Never   Tobacco comments:    more than 20 years ago  Substance Use Topics   Alcohol use: No    Alcohol/week: 0.0 standard drinks of alcohol     Current Outpatient Medications:    ACCU-CHEK GUIDE test strip, 1 each by Other route 3 (three) times daily. Use as instructed, Disp: 300 strip, Rfl: 0   amLODipine-benazepril (LOTREL) 5-40 MG capsule, Take 1 capsule by mouth daily., Disp: 90 capsule, Rfl: 1   aspirin 81 MG tablet, , Disp: , Rfl:     atorvastatin (LIPITOR) 80 MG tablet, Take 1 tablet (80 mg total) by mouth daily., Disp: 90 tablet, Rfl: 1   empagliflozin (JARDIANCE) 25 MG TABS tablet, Take 1 tablet (25 mg total) by mouth daily., Disp: 90 tablet, Rfl: 1   Glucagon (GVOKE HYPOPEN 1-PACK) 1 MG/0.2ML SOAJ, Inject 1 each into the skin daily as needed. Severe hypoglycemia, Disp: 0.2 mL, Rfl: 1   insulin aspart (NOVOLOG FLEXPEN) 100 UNIT/ML FlexPen, Inject 3-15 Units into the skin 3 (three) times daily with meals., Disp: 45 mL, Rfl: 1   Insulin Glargine-Lixisenatide (SOLIQUA) 100-33 UNT-MCG/ML SOPN, Inject 60 Units into the skin daily at 12 noon., Disp: 60 mL, Rfl: 1   Insulin Pen Needle (BD PEN NEEDLE MICRO U/F) 32G X 6 MM MISC, Inject 1 each into the skin 4 (four) times daily., Disp: 300 each, Rfl: 1   levocetirizine (XYZAL) 5 MG tablet, TAKE 1 TABLET (5 MG TOTAL) BY MOUTH EVERY EVENING., Disp: 90 tablet, Rfl: 0   omeprazole (PRILOSEC) 40 MG capsule, Take 1 capsule (40 mg total) by mouth daily., Disp: 90 capsule, Rfl: 1   tadalafil (CIALIS) 20 MG tablet, Take 1 tablet (20 mg total) by mouth every 3 (three) days., Disp: 90 tablet, Rfl: 0   valACYclovir (VALTREX) 500 MG tablet, Take 1 tablet (500 mg total) by mouth 3 (three) times daily., Disp: 30 tablet, Rfl: 2  No  Known Allergies  I personally reviewed active problem list, medication list, allergies, family history, social history, health maintenance with the patient/caregiver today.   ROS  Ten systems reviewed and is negative except as mentioned in HPI    Objective  Vitals:   10/11/23 0737  BP: 130/70  Pulse: 94  Resp: 16  SpO2: 99%  Weight: 212 lb (96.2 kg)  Height: 5\' 11"  (1.803 m)    Body mass index is 29.57 kg/m.  Physical Exam  Constitutional: Patient appears well-developed and well-nourished.  No distress.  HEENT: head atraumatic, normocephalic, pupils equal and reactive to light, neck supple Cardiovascular: Normal rate, regular rhythm and normal heart  sounds.  No murmur heard. No BLE edema. Pulmonary/Chest: Effort normal and breath sounds normal. No respiratory distress. Abdominal: Soft.  There is no tenderness. Psychiatric: Patient has a normal mood and affect. behavior is normal. Judgment and thought content normal.    PHQ2/9:    10/11/2023    7:37 AM 08/13/2023    2:20 PM 02/26/2023    3:52 PM 09/14/2022    8:22 AM 06/12/2022    8:00 AM  Depression screen PHQ 2/9  Decreased Interest 0 0 0 0 0  Down, Depressed, Hopeless 0 0 0 0 0  PHQ - 2 Score 0 0 0 0 0  Altered sleeping 0 0 0 0 0  Tired, decreased energy 0 0 0 0 0  Change in appetite 0 0 0 0 0  Feeling bad or failure about yourself  0 0 0 0 0  Trouble concentrating 0 0 0 0 0  Moving slowly or fidgety/restless 0 0 0 0 0  Suicidal thoughts 0 0 0 0 0  PHQ-9 Score 0 0 0 0 0    phq 9 is negative   Fall Risk:    10/11/2023    7:37 AM 08/13/2023    2:19 PM 02/26/2023    3:52 PM 09/14/2022    8:22 AM 06/12/2022    8:00 AM  Fall Risk   Falls in the past year? 0 0 0 0 0  Number falls in past yr: 0 0  0 0  Injury with Fall? 0 0  0 0  Risk for fall due to : No Fall Risks No Fall Risks No Fall Risks No Fall Risks No Fall Risks  Follow up Falls prevention discussed Falls prevention discussed Falls prevention discussed Falls prevention discussed Falls prevention discussed      Functional Status Survey: Is the patient deaf or have difficulty hearing?: No Does the patient have difficulty seeing, even when wearing glasses/contacts?: No Does the patient have difficulty concentrating, remembering, or making decisions?: No Does the patient have difficulty walking or climbing stairs?: No Does the patient have difficulty dressing or bathing?: No Does the patient have difficulty doing errands alone such as visiting a doctor's office or shopping?: No    Assessment & Plan  1. Dyslipidemia associated with type 2 diabetes mellitus (HCC)  - Continuous Glucose Sensor (DEXCOM G7 SENSOR) MISC;  1 each by Does not apply route as directed.  Dispense: 3 each; Refill: 1  Continue life style modifications  Having an eye exam today

## 2023-10-11 ENCOUNTER — Ambulatory Visit: Payer: 59 | Admitting: Family Medicine

## 2023-10-11 ENCOUNTER — Encounter: Payer: Self-pay | Admitting: Family Medicine

## 2023-10-11 VITALS — BP 130/70 | HR 94 | Resp 16 | Ht 71.0 in | Wt 212.0 lb

## 2023-10-11 DIAGNOSIS — E1169 Type 2 diabetes mellitus with other specified complication: Secondary | ICD-10-CM | POA: Diagnosis not present

## 2023-10-11 DIAGNOSIS — E785 Hyperlipidemia, unspecified: Secondary | ICD-10-CM | POA: Diagnosis not present

## 2023-10-11 DIAGNOSIS — Z794 Long term (current) use of insulin: Secondary | ICD-10-CM

## 2023-10-11 MED ORDER — DEXCOM G7 SENSOR MISC: 1.0000 | 1 refills | Status: DC

## 2023-11-14 ENCOUNTER — Encounter: Payer: Self-pay | Admitting: Family Medicine

## 2023-11-22 ENCOUNTER — Other Ambulatory Visit: Payer: Self-pay | Admitting: Family Medicine

## 2023-11-22 DIAGNOSIS — E1169 Type 2 diabetes mellitus with other specified complication: Secondary | ICD-10-CM

## 2023-11-22 MED ORDER — DEXCOM G7 SENSOR MISC: 1.0000 | 3 refills | Status: DC

## 2023-12-13 ENCOUNTER — Ambulatory Visit: Payer: 59 | Admitting: Family Medicine

## 2023-12-14 ENCOUNTER — Ambulatory Visit: Payer: 59 | Admitting: Family Medicine

## 2023-12-14 ENCOUNTER — Encounter: Payer: Self-pay | Admitting: Family Medicine

## 2023-12-14 VITALS — BP 134/72 | HR 97 | Temp 97.9°F | Resp 16 | Ht 71.0 in | Wt 217.7 lb

## 2023-12-14 DIAGNOSIS — E785 Hyperlipidemia, unspecified: Secondary | ICD-10-CM | POA: Diagnosis not present

## 2023-12-14 DIAGNOSIS — E1140 Type 2 diabetes mellitus with diabetic neuropathy, unspecified: Secondary | ICD-10-CM | POA: Diagnosis not present

## 2023-12-14 DIAGNOSIS — I10 Essential (primary) hypertension: Secondary | ICD-10-CM

## 2023-12-14 DIAGNOSIS — K219 Gastro-esophageal reflux disease without esophagitis: Secondary | ICD-10-CM | POA: Diagnosis not present

## 2023-12-14 DIAGNOSIS — N521 Erectile dysfunction due to diseases classified elsewhere: Secondary | ICD-10-CM

## 2023-12-14 DIAGNOSIS — E1169 Type 2 diabetes mellitus with other specified complication: Secondary | ICD-10-CM

## 2023-12-14 LAB — POCT GLYCOSYLATED HEMOGLOBIN (HGB A1C): Hemoglobin A1C: 6.4 % — AB (ref 4.0–5.6)

## 2023-12-14 NOTE — Progress Notes (Signed)
 Name: Joseph Johnson   MRN: [id number]    DOB: 1960-12-17   Date:12/14/2023       Progress Note  Subjective  Chief Complaint  Chief Complaint  Patient presents with   Medical Management of Chronic Issues    HPI  DMII: A1C was up to 8.2 %, patient has been compliant with his medications, using Dexcom 7 and has been more mindful of his diet, his A1C today is down to 6.4 %, he has noticed some hypoglycemic episodes and fasting is around 90. He denies polyphagia, polydipsia or polyuria    HTN: he  has been compliant with medication, Lotrel. No chest pain , dizziness or palpitation  BP is at goal today.    Hyperlipidemia: he states he has been compliant with medication since his last visit, recheck yearly   ED: he states Cialis   every 3 days , able to sustain and erection, states no longer having to take it as often, ED secondary to DM   GERD: taking Omeprazole  and is doing well , denies heartburn or indigestion     Patient Active Problem List   Diagnosis Date Noted   Type 2 diabetes mellitus with diabetic neuropathy, with long-term current use of insulin  (HCC) 09/14/2022   Gastroesophageal reflux disease without esophagitis 09/14/2022   Hypertension complicating diabetes (HCC) 09/14/2022   Dyslipidemia associated with type 2 diabetes mellitus (HCC) 09/14/2022   Allergic rhinitis, seasonal 03/03/2016   Hypercholesteremia 06/20/2015   Benign essential HTN 02/02/2008    History reviewed. No pertinent surgical history.  Family History  Problem Relation Age of Onset   Hypertension Mother    Diabetes Father    Diabetes Brother    Hypercalcemia Brother    Hypertension Brother     Social History   Tobacco Use   Smoking status: Former    Current packs/day: 0.00    Types: Cigarettes    Quit date: 09/19/1990    Years since quitting: 33.2   Smokeless tobacco: Never   Tobacco comments:    more than 20 years ago  Substance Use Topics   Alcohol use: No    Alcohol/week: 0.0  standard drinks of alcohol     Current Outpatient Medications:    ACCU-CHEK GUIDE test strip, 1 each by Other route 3 (three) times daily. Use as instructed, Disp: 300 strip, Rfl: 0   amLODipine -benazepril  (LOTREL) 5-40 MG capsule, Take 1 capsule by mouth daily., Disp: 90 capsule, Rfl: 1   aspirin 81 MG tablet, , Disp: , Rfl:    atorvastatin  (LIPITOR) 80 MG tablet, Take 1 tablet (80 mg total) by mouth daily., Disp: 90 tablet, Rfl: 1   Continuous Glucose Sensor (DEXCOM G7 SENSOR) MISC, 1 each by Does not apply route as directed., Disp: 9 each, Rfl: 3   empagliflozin  (JARDIANCE ) 25 MG TABS tablet, Take 1 tablet (25 mg total) by mouth daily., Disp: 90 tablet, Rfl: 1   Glucagon  (GVOKE HYPOPEN  1-PACK) 1 MG/0.2ML SOAJ, Inject 1 each into the skin daily as needed. Severe hypoglycemia, Disp: 0.2 mL, Rfl: 1   insulin  aspart (NOVOLOG  FLEXPEN) 100 UNIT/ML FlexPen, Inject 3-15 Units into the skin 3 (three) times daily with meals., Disp: 45 mL, Rfl: 1   Insulin  Glargine-Lixisenatide (SOLIQUA ) 100-33 UNT-MCG/ML SOPN, Inject 60 Units into the skin daily at 12 noon., Disp: 60 mL, Rfl: 1   Insulin  Pen Needle (BD PEN NEEDLE MICRO U/F) 32G X 6 MM MISC, Inject 1 each into the skin 4 (four) times daily., Disp: 300  each, Rfl: 1   levocetirizine (XYZAL ) 5 MG tablet, TAKE 1 TABLET (5 MG TOTAL) BY MOUTH EVERY EVENING., Disp: 90 tablet, Rfl: 0   omeprazole  (PRILOSEC) 40 MG capsule, Take 1 capsule (40 mg total) by mouth daily., Disp: 90 capsule, Rfl: 1   tadalafil  (CIALIS ) 20 MG tablet, Take 1 tablet (20 mg total) by mouth every 3 (three) days., Disp: 90 tablet, Rfl: 0   valACYclovir  (VALTREX ) 500 MG tablet, Take 1 tablet (500 mg total) by mouth 3 (three) times daily., Disp: 30 tablet, Rfl: 2  No Known Allergies  I personally reviewed active problem list, medication list, allergies with the patient/caregiver today.   ROS  Ten systems reviewed and is negative except as mentioned in HPI    Objective  Vitals:    12/14/23 1444  BP: 134/72  Pulse: 97  Resp: 16  Temp: 97.9 F (36.6 C)  TempSrc: Oral  Weight: 217 lb 11.2 oz (98.7 kg)  Height: 5' 11 (1.803 m)    Body mass index is 30.36 kg/m.  Physical Exam  Constitutional: Patient appears well-developed and well-nourished. Obese  No distress.  HEENT: head atraumatic, normocephalic, pupils equal and reactive to light, neck supple Cardiovascular: Normal rate, regular rhythm and normal heart sounds.  No murmur heard. No BLE edema. Pulmonary/Chest: Effort normal and breath sounds normal. No respiratory distress. Abdominal: Soft.  There is no tenderness. Psychiatric: Patient has a normal mood and affect. behavior is normal. Judgment and thought content normal.   Recent Results (from the past 2160 hours)  POCT glycosylated hemoglobin (Hb A1C)     Status: Abnormal   Collection Time: 12/14/23  2:47 PM  Result Value Ref Range   Hemoglobin A1C 6.4 (A) 4.0 - 5.6 %   HbA1c POC (<> result, manual entry)     HbA1c, POC (prediabetic range)     HbA1c, POC (controlled diabetic range)      Diabetic Foot Exam:     PHQ2/9:    12/14/2023    2:44 PM 10/11/2023    7:37 AM 08/13/2023    2:20 PM 02/26/2023    3:52 PM 09/14/2022    8:22 AM  Depression screen PHQ 2/9  Decreased Interest 0 0 0 0 0  Down, Depressed, Hopeless 0 0 0 0 0  PHQ - 2 Score 0 0 0 0 0  Altered sleeping 0 0 0 0 0  Tired, decreased energy 0 0 0 0 0  Change in appetite 0 0 0 0 0  Feeling bad or failure about yourself  0 0 0 0 0  Trouble concentrating 0 0 0 0 0  Moving slowly or fidgety/restless 0 0 0 0 0  Suicidal thoughts 0 0 0 0 0  PHQ-9 Score 0 0 0 0 0  Difficult doing work/chores Not difficult at all        phq 9 is negative   Fall Risk:    12/14/2023    2:34 PM 10/11/2023    7:37 AM 08/13/2023    2:19 PM 02/26/2023    3:52 PM 09/14/2022    8:22 AM  Fall Risk   Falls in the past year? 0 0 0 0 0  Number falls in past yr: 0 0 0  0  Injury with Fall? 0 0 0  0  Risk for fall  due to : No Fall Risks No Fall Risks No Fall Risks No Fall Risks No Fall Risks  Follow up Falls prevention discussed;Education provided;Falls evaluation completed Falls prevention  discussed Falls prevention discussed Falls prevention discussed Falls prevention discussed     Assessment & Plan  Assessment and Plan    Type 2 Diabetes Mellitus Improved control with A1c of 6.4% from 8.2% four months ago. Patient is using Dexcom for real-time glucose monitoring. Postprandial glucose levels have improved but still reach 220-230. Patient has been adjusting Soliqua  dose based on daily readings, leading to potential risk of hypoglycemia. -Continue Soliqua  60 units daily, but decrease to 58 units for three days due to fasting glucose -Adjust Soliqua  dose based on three-day average of fasting glucose levels, not daily readings. -Continue Novolog  per sliding scale. -Continue Jardiance  25mg  daily.  Dyslipidemia LDL increased from 70 to 128 over two yearsbut he states at the time he was not compliant with  atorvastatin  80mg  daily. -Continue atorvastatin  80mg  daily. -Plan to recheck lipid panel.  Hypertension Controlled on Lotrel 5/40mg  daily. -Continue Lotrel 5/40mg  daily.  Gastroesophageal Reflux Disease Controlled on omeprazole . -Continue omeprazole .  Erectile Dysfunction/DM with ED Improved with Cialis  as needed every three days. -Continue Cialis  as needed every three days.  General Health Maintenance -Ensure patient is receiving correct quantity of Dexcom supplies (9 sensors every three months). -Encourage patient to have a snack before bed to prevent nocturnal hypoglycemia, avoiding sugary drinks to prevent dental issues.

## 2023-12-15 ENCOUNTER — Other Ambulatory Visit: Payer: Self-pay | Admitting: Family Medicine

## 2023-12-15 DIAGNOSIS — E1169 Type 2 diabetes mellitus with other specified complication: Secondary | ICD-10-CM

## 2023-12-17 ENCOUNTER — Other Ambulatory Visit: Payer: Self-pay | Admitting: Family Medicine

## 2023-12-17 DIAGNOSIS — E1169 Type 2 diabetes mellitus with other specified complication: Secondary | ICD-10-CM

## 2023-12-29 ENCOUNTER — Other Ambulatory Visit: Payer: Self-pay

## 2023-12-29 DIAGNOSIS — E1169 Type 2 diabetes mellitus with other specified complication: Secondary | ICD-10-CM

## 2023-12-29 MED ORDER — SOLIQUA 100-33 UNT-MCG/ML ~~LOC~~ SOPN: 60.0000 [IU] | PEN_INJECTOR | Freq: Every morning | SUBCUTANEOUS | 1 refills | Status: DC

## 2023-12-29 NOTE — Telephone Encounter (Signed)
Previous rx failed to send electronically

## 2024-01-24 ENCOUNTER — Encounter: Payer: Self-pay | Admitting: Family Medicine

## 2024-01-24 ENCOUNTER — Ambulatory Visit: Payer: 59 | Admitting: Nurse Practitioner

## 2024-01-24 VITALS — BP 122/76 | HR 91 | Temp 98.7°F | Resp 16 | Ht 71.0 in | Wt 217.0 lb

## 2024-01-24 DIAGNOSIS — J0140 Acute pansinusitis, unspecified: Secondary | ICD-10-CM | POA: Diagnosis not present

## 2024-01-24 MED ORDER — AMOXICILLIN-POT CLAVULANATE 875-125 MG PO TABS: 1.0000 | ORAL_TABLET | Freq: Two times a day (BID) | ORAL | 0 refills | Status: DC

## 2024-01-24 NOTE — Patient Instructions (Signed)
 Mucinex  Zyrtec/Claritin and flonase  Push fluids

## 2024-01-24 NOTE — Progress Notes (Signed)
 BP 122/76   Pulse 91   Temp 98.7 F (37.1 C)   Resp 16   Ht 5\' 11"  (1.803 m)   Wt 217 lb (98.4 kg)   SpO2 98%   BMI 30.27 kg/m    Subjective:    Patient ID: Joseph Johnson, male    DOB: [birth year], 63 y.o.   MRN: [id number]  HPI: Joseph Johnson is a 63 y.o. male  Chief Complaint  Patient presents with   Sinusitis    x5 days    Discussed the use of AI scribe software for clinical note transcription with the patient, who gave verbal consent to proceed.  History of Present Illness   The patient presents with a week-long history of sinus pressure and headache, which began after attending a large conference for work. He initially experienced a severe cough, which has since improved significantly. The sinus pressure is described as 'unbearable' and is located behind the nose, associated with a mild headache. He denies fever, pain on palpation of the sinuses, difficulty swallowing, and sore throat. The patient has been self-treating with Theraflu, which has provided some relief but has not fully resolved the symptoms.       12/14/2023    2:44 PM 10/11/2023    7:37 AM 08/13/2023    2:20 PM  Depression screen PHQ 2/9  Decreased Interest 0 0 0  Down, Depressed, Hopeless 0 0 0  PHQ - 2 Score 0 0 0  Altered sleeping 0 0 0  Tired, decreased energy 0 0 0  Change in appetite 0 0 0  Feeling bad or failure about yourself  0 0 0  Trouble concentrating 0 0 0  Moving slowly or fidgety/restless 0 0 0  Suicidal thoughts 0 0 0  PHQ-9 Score 0 0 0  Difficult doing work/chores Not difficult at all      Relevant past medical, surgical, family and social history reviewed and updated as indicated. Interim medical history since our last visit reviewed. Allergies and medications reviewed and updated.  Review of Systems  Ten systems reviewed and is negative except as mentioned in HPI      Objective:    BP 122/76   Pulse 91   Temp 98.7 F (37.1 C)   Resp 16   Ht 5\' 11"  (1.803 m)    Wt 217 lb (98.4 kg)   SpO2 98%   BMI 30.27 kg/m    Wt Readings from Last 3 Encounters:  01/24/24 217 lb (98.4 kg)  12/14/23 217 lb 11.2 oz (98.7 kg)  10/11/23 212 lb (96.2 kg)    Physical Exam Vitals reviewed.  Constitutional:      Appearance: Normal appearance.  HENT:     Head: Normocephalic.     Right Ear: Tympanic membrane normal.     Left Ear: Tympanic membrane normal.     Nose:     Right Sinus: Maxillary sinus tenderness and frontal sinus tenderness present.     Left Sinus: Maxillary sinus tenderness and frontal sinus tenderness present.     Mouth/Throat:     Mouth: Mucous membranes are moist.     Pharynx: Oropharynx is clear. No oropharyngeal exudate or posterior oropharyngeal erythema.  Eyes:     Extraocular Movements: Extraocular movements intact.     Conjunctiva/sclera: Conjunctivae normal.     Pupils: Pupils are equal, round, and reactive to light.  Cardiovascular:     Rate and Rhythm: Normal rate and regular rhythm.  Pulmonary:  Effort: Pulmonary effort is normal.     Breath sounds: Normal breath sounds.  Lymphadenopathy:     Cervical: No cervical adenopathy.  Skin:    General: Skin is warm and dry.  Neurological:     General: No focal deficit present.     Mental Status: He is alert and oriented to person, place, and time. Mental status is at baseline.  Psychiatric:        Mood and Affect: Mood normal.        Behavior: Behavior normal.        Thought Content: Thought content normal.        Judgment: Judgment normal.     Results for orders placed or performed in visit on 12/14/23  POCT glycosylated hemoglobin (Hb A1C)   Collection Time: 12/14/23  2:47 PM  Result Value Ref Range   Hemoglobin A1C 6.4 (A) 4.0 - 5.6 %   HbA1c POC (<> result, manual entry)     HbA1c, POC (prediabetic range)     HbA1c, POC (controlled diabetic range)         Assessment & Plan:   Problem List Items Addressed This Visit   None Visit Diagnoses       Acute  non-recurrent pansinusitis    -  Primary   Relevant Medications   amoxicillin-clavulanate (AUGMENTIN) 875-125 MG tablet        Assessment and Plan    Sinusitis Symptoms of sinus pressure, mild headache, and prior cough. No fever. Throat appears normal on examination and lungs are clear. -Continue Theraflu as needed. -Add Mucinex to thin mucus. -Add over-the-counter Zyrtec or Claritin to help dry up sinuses. -Add Flonase to help open up sinuses. -Prescribe antibiotic Augmentin -Encourage hydration.  -Follow up as needed.        Follow up plan: Return if symptoms worsen or fail to improve.

## 2024-02-28 ENCOUNTER — Other Ambulatory Visit: Payer: Self-pay | Admitting: Family Medicine

## 2024-02-28 DIAGNOSIS — Z794 Long term (current) use of insulin: Secondary | ICD-10-CM

## 2024-02-28 DIAGNOSIS — E1169 Type 2 diabetes mellitus with other specified complication: Secondary | ICD-10-CM

## 2024-03-13 ENCOUNTER — Ambulatory Visit: Payer: Self-pay | Admitting: Family Medicine

## 2024-04-04 ENCOUNTER — Ambulatory Visit: Admitting: Family Medicine

## 2024-04-04 ENCOUNTER — Encounter: Payer: Self-pay | Admitting: Family Medicine

## 2024-04-04 VITALS — BP 126/82 | HR 83 | Resp 16 | Ht 71.0 in | Wt 208.2 lb

## 2024-04-04 DIAGNOSIS — I10 Essential (primary) hypertension: Secondary | ICD-10-CM

## 2024-04-04 DIAGNOSIS — E785 Hyperlipidemia, unspecified: Secondary | ICD-10-CM

## 2024-04-04 DIAGNOSIS — K219 Gastro-esophageal reflux disease without esophagitis: Secondary | ICD-10-CM | POA: Diagnosis not present

## 2024-04-04 DIAGNOSIS — E1140 Type 2 diabetes mellitus with diabetic neuropathy, unspecified: Secondary | ICD-10-CM

## 2024-04-04 DIAGNOSIS — E1169 Type 2 diabetes mellitus with other specified complication: Secondary | ICD-10-CM

## 2024-04-04 DIAGNOSIS — E7800 Pure hypercholesterolemia, unspecified: Secondary | ICD-10-CM

## 2024-04-04 DIAGNOSIS — Z794 Long term (current) use of insulin: Secondary | ICD-10-CM

## 2024-04-04 LAB — POCT GLYCOSYLATED HEMOGLOBIN (HGB A1C): Hemoglobin A1C: 7.1 % — AB (ref 4.0–5.6)

## 2024-04-04 MED ORDER — SOLIQUA 100-33 UNT-MCG/ML ~~LOC~~ SOPN: 60.0000 [IU] | PEN_INJECTOR | Freq: Every morning | SUBCUTANEOUS | 1 refills | Status: DC

## 2024-04-04 MED ORDER — AMLODIPINE BESY-BENAZEPRIL HCL 5-40 MG PO CAPS: 1.0000 | ORAL_CAPSULE | Freq: Every day | ORAL | 1 refills | Status: DC

## 2024-04-04 MED ORDER — OMEPRAZOLE 40 MG PO CPDR: 40.0000 mg | DELAYED_RELEASE_CAPSULE | Freq: Every day | ORAL | 1 refills | Status: DC

## 2024-04-04 MED ORDER — ATORVASTATIN CALCIUM 80 MG PO TABS: 80.0000 mg | ORAL_TABLET | Freq: Every day | ORAL | 1 refills | Status: DC

## 2024-04-04 MED ORDER — NOVOLOG FLEXPEN 100 UNIT/ML ~~LOC~~ SOPN: 3.0000 [IU] | PEN_INJECTOR | Freq: Three times a day (TID) | SUBCUTANEOUS | 1 refills | Status: DC

## 2024-04-04 MED ORDER — TADALAFIL 20 MG PO TABS: 20.0000 mg | ORAL_TABLET | ORAL | 0 refills | Status: DC

## 2024-04-04 MED ORDER — EMPAGLIFLOZIN 25 MG PO TABS: 25.0000 mg | ORAL_TABLET | Freq: Every day | ORAL | 1 refills | Status: DC

## 2024-04-04 NOTE — Progress Notes (Signed)
 Name: Joseph Johnson   MRN: [id number]    DOB: Sep 30, 1961   Date:04/04/2024       Progress Note  Subjective  Chief Complaint  Chief Complaint  Patient presents with   Medical Management of Chronic Issues   Discussed the use of AI scribe software for clinical note transcription with the patient, who gave verbal consent to proceed.  History of Present Illness Joseph Johnson is a 63 year old male with diabetes, hypertension, and dyslipidemia who presents for a follow-up visit.  His A1c has increased from 6.4% in January to 7.1% currently. He has lost weight, going from 217 pounds to 208.2 pounds, attributed to dietary changes such as not eating before bed and avoiding fried foods. He consumes baked chicken during the day and spends more nights at home ( he was staying in hotels for work) , contributing to weight loss. He takes Jardiance  once daily and Soliqua  55 units daily and Novolog  sliding scale before meals  for diabetes management. Blood sugar levels average 120 mg/dL upon waking.  He needs medication refills and has issues obtaining them from the pharmacy. No symptoms of hyperglycemia such as excessive hunger, thirst, or frequent urination.  For dyslipidemia, he takes atorvastatin  but is inconsistent, often taking it post-meal when he remembers. His LDL cholesterol was previously elevated at 128 mg/dL, with a target below 595 mg/dL, ideally below 70 mg/dL. No muscle aches associated with atorvastatin  use.  Regarding hypertension, he takes amlodipine  and benazepril  at 5/40 mg. No chest pain, palpitations, or related symptoms. Blood pressure is well-controlled.  He has a history of reflux, improved with weight loss, and takes omeprazole . No recent heartburn or indigestion.  He has a history of erectile dysfunction/neuropathy due to DM  and takes Cialis , requesting a refill.    Patient Active Problem List   Diagnosis Date Noted   Type 2 diabetes mellitus with diabetic neuropathy,  with long-term current use of insulin  (HCC) 09/14/2022   Gastroesophageal reflux disease without esophagitis 09/14/2022   Hypertension complicating diabetes (HCC) 09/14/2022   Dyslipidemia associated with type 2 diabetes mellitus (HCC) 09/14/2022   Allergic rhinitis, seasonal 03/03/2016   Hypercholesteremia 06/20/2015   Benign essential HTN 02/02/2008    History reviewed. No pertinent surgical history.  Family History  Problem Relation Age of Onset   Hypertension Mother    Diabetes Father    Diabetes Brother    Hypercalcemia Brother    Hypertension Brother     Social History   Tobacco Use   Smoking status: Former    Current packs/day: 0.00    Types: Cigarettes    Quit date: 09/19/1990    Years since quitting: 33.5   Smokeless tobacco: Never   Tobacco comments:    more than 20 years ago  Substance Use Topics   Alcohol use: No    Alcohol/week: 0.0 standard drinks of alcohol     Current Outpatient Medications:    ACCU-CHEK GUIDE test strip, 1 each by Other route 3 (three) times daily. Use as instructed, Disp: 300 strip, Rfl: 0   amLODipine -benazepril  (LOTREL) 5-40 MG capsule, Take 1 capsule by mouth daily., Disp: 90 capsule, Rfl: 1   aspirin 81 MG tablet, , Disp: , Rfl:    atorvastatin  (LIPITOR) 80 MG tablet, Take 1 tablet (80 mg total) by mouth daily., Disp: 90 tablet, Rfl: 1   BD PEN NEEDLE MICRO U/F 32G X 6 MM MISC, INJECT 1 EACH INTO THE SKIN 4 (FOUR) TIMES DAILY., Disp: 400  each, Rfl: 1   Continuous Glucose Sensor (DEXCOM G7 SENSOR) MISC, 1 each by Does not apply route as directed., Disp: 9 each, Rfl: 3   empagliflozin  (JARDIANCE ) 25 MG TABS tablet, Take 1 tablet (25 mg total) by mouth daily., Disp: 90 tablet, Rfl: 1   Glucagon  (GVOKE HYPOPEN  1-PACK) 1 MG/0.2ML SOAJ, Inject 1 each into the skin daily as needed. Severe hypoglycemia, Disp: 0.2 mL, Rfl: 1   insulin  aspart (NOVOLOG  FLEXPEN) 100 UNIT/ML FlexPen, INJECT 3-15 UNITS INTO THE SKIN 3 (THREE) TIMES DAILY WITH  MEALS., Disp: 45 mL, Rfl: 1   Insulin  Glargine-Lixisenatide (SOLIQUA ) 100-33 UNT-MCG/ML SOPN, Inject 60 Units into the skin every morning., Disp: 45 mL, Rfl: 1   levocetirizine (XYZAL ) 5 MG tablet, TAKE 1 TABLET (5 MG TOTAL) BY MOUTH EVERY EVENING., Disp: 90 tablet, Rfl: 0   omeprazole  (PRILOSEC) 40 MG capsule, Take 1 capsule (40 mg total) by mouth daily., Disp: 90 capsule, Rfl: 1   tadalafil  (CIALIS ) 20 MG tablet, Take 1 tablet (20 mg total) by mouth every 3 (three) days., Disp: 90 tablet, Rfl: 0   valACYclovir  (VALTREX ) 500 MG tablet, Take 1 tablet (500 mg total) by mouth 3 (three) times daily., Disp: 30 tablet, Rfl: 2   amoxicillin -clavulanate (AUGMENTIN ) 875-125 MG tablet, Take 1 tablet by mouth 2 (two) times daily. (Patient not taking: Reported on 04/04/2024), Disp: 20 tablet, Rfl: 0  No Known Allergies  I personally reviewed active problem list, medication list, allergies, family history with the patient/caregiver today.   ROS  Ten systems reviewed and is negative except as mentioned in HPI    Objective Physical Exam VITALS: BP- 126/82 MEASUREMENTS: Weight- 208.2. CONSTITUTIONAL: Patient appears well-developed and well-nourished. No distress. HEENT: Head atraumatic, normocephalic, neck supple. Palpable lymph node on neck. CARDIOVASCULAR: Normal rate, regular rhythm and normal heart sounds. No murmur heard. No BLE edema. PULMONARY: Effort normal and breath sounds normal. No respiratory distress. ABDOMINAL: There is no tenderness or distention. MUSCULOSKELETAL: Normal gait. Without gross motor or sensory deficit. PSYCHIATRIC: Patient has a normal mood and affect. Behavior is normal. Judgment and thought content normal.  Vitals:   04/04/24 0849  BP: 126/82  Pulse: 83  Resp: 16  Weight: 208 lb 3.2 oz (94.4 kg)  Height: 5\' 11"  (1.803 m)    Body mass index is 29.04 kg/m.  Recent Results (from the past 2160 hours)  POCT glycosylated hemoglobin (Hb A1C)     Status: Abnormal    Collection Time: 04/04/24  8:56 AM  Result Value Ref Range   Hemoglobin A1C 7.1 (A) 4.0 - 5.6 %   HbA1c POC (<> result, manual entry)     HbA1c, POC (prediabetic range)     HbA1c, POC (controlled diabetic range)        PHQ2/9:    04/04/2024    8:43 AM 12/14/2023    2:44 PM 10/11/2023    7:37 AM 08/13/2023    2:20 PM 02/26/2023    3:52 PM  Depression screen PHQ 2/9  Decreased Interest 0 0 0 0 0  Down, Depressed, Hopeless 0 0 0 0 0  PHQ - 2 Score 0 0 0 0 0  Altered sleeping 0 0 0 0 0  Tired, decreased energy 0 0 0 0 0  Change in appetite 0 0 0 0 0  Feeling bad or failure about yourself  0 0 0 0 0  Trouble concentrating 0 0 0 0 0  Moving slowly or fidgety/restless 0 0 0 0 0  Suicidal thoughts 0 0 0 0 0  PHQ-9 Score 0 0 0 0 0  Difficult doing work/chores Not difficult at all Not difficult at all       phq 9 is negative  Fall Risk:    12/14/2023    2:34 PM 10/11/2023    7:37 AM 08/13/2023    2:19 PM 02/26/2023    3:52 PM 09/14/2022    8:22 AM  Fall Risk   Falls in the past year? 0 0 0 0 0  Number falls in past yr: 0 0 0  0  Injury with Fall? 0 0 0  0  Risk for fall due to : No Fall Risks No Fall Risks No Fall Risks No Fall Risks No Fall Risks  Follow up Falls prevention discussed;Education provided;Falls evaluation completed Falls prevention discussed Falls prevention discussed Falls prevention discussed Falls prevention discussed      Assessment & Plan Type 2 diabetes mellitus associated with dyslipidemia , HTN and neuropathy causing ED A1c increased to 7.1%. Weight loss likely due to dietary changes. Emphasized consistent medication timing and adherence. - Continue Jardiance  once daily. - Continue Soliqua  55 units once daily. - Use Novolog  on a sliding scale for carbohydrate intake. - Refill prescriptions for Novolog  and Soliqua . - Schedule follow-up in four months to reassess A1c and glycemic control.  Hypertension Blood pressure well-controlled at 126/82 mmHg.  Emphasized medication adherence to prevent complications. - Continue amlodipine -benazepril  5/40 mg daily. - Advise taking all medications at the same time each day.  Dyslipidemia LDL cholesterol elevated at 128 mg/dL. Emphasized importance of atorvastatin  for cardiovascular risk reduction. - Continue atorvastatin  daily. - Emphasize taking atorvastatin  at the same time each day. - Refill atorvastatin  prescription.  Erectile  dysfunction well-managed. No new symptoms reported. - Refill Cialis  prescription to be filled at [facility].  Gastro-esophageal reflux disease (GERD) GERD symptoms improved with weight loss. - Continue omeprazole  daily. - Advise taking omeprazole  in the morning.

## 2024-08-04 ENCOUNTER — Encounter: Payer: Self-pay | Admitting: Family Medicine

## 2024-08-04 ENCOUNTER — Ambulatory Visit (INDEPENDENT_AMBULATORY_CARE_PROVIDER_SITE_OTHER): Admitting: Family Medicine

## 2024-08-04 VITALS — BP 138/76 | HR 77 | Resp 16 | Ht 71.0 in | Wt 216.1 lb

## 2024-08-04 DIAGNOSIS — E1169 Type 2 diabetes mellitus with other specified complication: Secondary | ICD-10-CM | POA: Diagnosis not present

## 2024-08-04 DIAGNOSIS — E1159 Type 2 diabetes mellitus with other circulatory complications: Secondary | ICD-10-CM | POA: Diagnosis not present

## 2024-08-04 DIAGNOSIS — Z23 Encounter for immunization: Secondary | ICD-10-CM

## 2024-08-04 DIAGNOSIS — E1140 Type 2 diabetes mellitus with diabetic neuropathy, unspecified: Secondary | ICD-10-CM | POA: Diagnosis not present

## 2024-08-04 DIAGNOSIS — I152 Hypertension secondary to endocrine disorders: Secondary | ICD-10-CM

## 2024-08-04 DIAGNOSIS — E785 Hyperlipidemia, unspecified: Secondary | ICD-10-CM

## 2024-08-04 DIAGNOSIS — E7800 Pure hypercholesterolemia, unspecified: Secondary | ICD-10-CM

## 2024-08-04 DIAGNOSIS — I10 Essential (primary) hypertension: Secondary | ICD-10-CM

## 2024-08-04 DIAGNOSIS — Z794 Long term (current) use of insulin: Secondary | ICD-10-CM

## 2024-08-04 DIAGNOSIS — K219 Gastro-esophageal reflux disease without esophagitis: Secondary | ICD-10-CM | POA: Diagnosis not present

## 2024-08-04 LAB — POCT GLYCOSYLATED HEMOGLOBIN (HGB A1C): Hemoglobin A1C: 6.4 % — AB (ref 4.0–5.6)

## 2024-08-04 MED ORDER — TADALAFIL 20 MG PO TABS: 20.0000 mg | ORAL_TABLET | ORAL | 0 refills | Status: DC

## 2024-08-04 NOTE — Progress Notes (Signed)
 Name: Joseph Johnson   MRN: [id number]    DOB: 01-Feb-1961   Date:08/04/2024       Progress Note  Subjective  Chief Complaint  Chief Complaint  Patient presents with   Medical Management of Chronic Issues   Discussed the use of AI scribe software for clinical note transcription with the patient, who gave verbal consent to proceed.  History of Present Illness TIGE MEAS is a 63 year old male with type 2 diabetes who presents for diabetes management and medication review.  He has experienced a decrease in A1c from 7.1% in April to 6.4% currently. Morning blood sugar readings are typically around 125-130 mg/dL. He experiences hypoglycemic episodes approximately once or twice a week, with blood sugar levels dropping to 65-70 mg/dL, but manages these episodes effectively. He uses a continuous glucose monitor (CGM) which allows him to closely monitor his glucose levels. His current diabetes medications include Novolog  before each meal, Soliqua  60 units once daily, and Jardiance . He needs a refill for his Dexcom sensors as he is running low.  Date of Download: 08/04/2024  % Time CGM is active: 95% Average Glucose: 138 mg/dL Glucose Management Indicator: Dexcom 7  Glucose Variability: 43.3 (goal <36%) Time in Goal:  - Time in range 70-180: 70% - Time above range: 21% - Time below range: 9%   He has a history of hypertension, with a recent blood pressure reading of 138/76 mmHg, slightly elevated from his usual readings. He takes amlodipine  and benazepril  daily, although he has not yet taken his dose today. At home, his blood pressure is generally well-controlled.  He is on atorvastatin  80 mg daily for dyslipidemia, with a history of triglycerides at 155 mg/dL as of September last year. He takes his atorvastatin  regularly.  He has a history of erectile dysfunction for which he uses Cialis  as needed.  He reports a recent weight gain.  He frequently travels to [place] and [place]  for work, which impacts his ability to return for follow-up visits.  No recent episodes of confusion or needing assistance during hypoglycemic episodes. No issues with heartburn or indigestion while on omeprazole .    Patient Active Problem List   Diagnosis Date Noted   Type 2 diabetes mellitus with diabetic neuropathy, with long-term current use of insulin  (HCC) 09/14/2022   Gastroesophageal reflux disease without esophagitis 09/14/2022   Hypertension complicating diabetes (HCC) 09/14/2022   Dyslipidemia associated with type 2 diabetes mellitus (HCC) 09/14/2022   Allergic rhinitis, seasonal 03/03/2016   Hypercholesteremia 06/20/2015   Benign essential HTN 02/02/2008    History reviewed. No pertinent surgical history.  Family History  Problem Relation Age of Onset   Hypertension Mother    Diabetes Father    Diabetes Brother    Hypercalcemia Brother    Hypertension Brother     Social History   Tobacco Use   Smoking status: Former    Current packs/day: 0.00    Types: Cigarettes    Quit date: 09/19/1990    Years since quitting: 33.8   Smokeless tobacco: Never   Tobacco comments:    more than 20 years ago  Substance Use Topics   Alcohol use: No    Alcohol/week: 0.0 standard drinks of alcohol     Current Outpatient Medications:    ACCU-CHEK GUIDE test strip, 1 each by Other route 3 (three) times daily. Use as instructed, Disp: 300 strip, Rfl: 0   amLODipine -benazepril  (LOTREL) 5-40 MG capsule, Take 1 capsule by mouth daily., Disp: 90  capsule, Rfl: 1   aspirin 81 MG tablet, , Disp: , Rfl:    atorvastatin  (LIPITOR) 80 MG tablet, Take 1 tablet (80 mg total) by mouth daily., Disp: 90 tablet, Rfl: 1   BD PEN NEEDLE MICRO U/F 32G X 6 MM MISC, INJECT 1 EACH INTO THE SKIN 4 (FOUR) TIMES DAILY., Disp: 400 each, Rfl: 1   Continuous Glucose Sensor (DEXCOM G7 SENSOR) MISC, 1 each by Does not apply route as directed., Disp: 9 each, Rfl: 3   empagliflozin  (JARDIANCE ) 25 MG TABS tablet,  Take 1 tablet (25 mg total) by mouth daily., Disp: 90 tablet, Rfl: 1   insulin  aspart (NOVOLOG  FLEXPEN) 100 UNIT/ML FlexPen, Inject 3-15 Units into the skin 3 (three) times daily with meals., Disp: 45 mL, Rfl: 1   Insulin  Glargine-Lixisenatide (SOLIQUA ) 100-33 UNT-MCG/ML SOPN, Inject 60 Units into the skin every morning., Disp: 45 mL, Rfl: 1   levocetirizine (XYZAL ) 5 MG tablet, TAKE 1 TABLET (5 MG TOTAL) BY MOUTH EVERY EVENING., Disp: 90 tablet, Rfl: 0   omeprazole  (PRILOSEC) 40 MG capsule, Take 1 capsule (40 mg total) by mouth daily., Disp: 90 capsule, Rfl: 1   valACYclovir  (VALTREX ) 500 MG tablet, Take 1 tablet (500 mg total) by mouth 3 (three) times daily., Disp: 30 tablet, Rfl: 2   Glucagon  (GVOKE HYPOPEN  1-PACK) 1 MG/0.2ML SOAJ, Inject 1 each into the skin daily as needed. Severe hypoglycemia (Patient not taking: Reported on 08/04/2024), Disp: 0.2 mL, Rfl: 1   tadalafil  (CIALIS ) 20 MG tablet, Take 1 tablet (20 mg total) by mouth every 3 (three) days., Disp: 90 tablet, Rfl: 0  No Known Allergies  I personally reviewed active problem list, medication list, allergies with the patient/caregiver today.   ROS  Ten systems reviewed and is negative except as mentioned in HPI    Objective Physical Exam  CONSTITUTIONAL: Patient appears well-developed and well-nourished.  No distress. HEENT: Head atraumatic, normocephalic, neck supple. CARDIOVASCULAR: Normal rate, regular rhythm and normal heart sounds.  No murmur heard. No BLE edema. PULMONARY: Effort normal and breath sounds normal. No respiratory distress. MUSCULOSKELETAL: Normal gait. Without gross motor or sensory deficit. PSYCHIATRIC: Patient has a normal mood and affect. behavior is normal. Judgment and thought content normal.  Vitals:   08/04/24 0842  BP: 138/76  Pulse: 77  Resp: 16  SpO2: 98%  Weight: 216 lb 1.6 oz (98 kg)  Height: 5' 11 (1.803 m)    Body mass index is 30.14 kg/m.  Recent Results (from the past 2160  hours)  POCT glycosylated hemoglobin (Hb A1C)     Status: Abnormal   Collection Time: 08/04/24  8:45 AM  Result Value Ref Range   Hemoglobin A1C 6.4 (A) 4.0 - 5.6 %   HbA1c POC (<> result, manual entry)     HbA1c, POC (prediabetic range)     HbA1c, POC (controlled diabetic range)      Diabetic Foot Exam:     PHQ2/9:    08/04/2024    8:35 AM 04/04/2024    8:43 AM 12/14/2023    2:44 PM 10/11/2023    7:37 AM 08/13/2023    2:20 PM  Depression screen PHQ 2/9  Decreased Interest 0 0 0 0 0  Down, Depressed, Hopeless 0 0 0 0 0  PHQ - 2 Score 0 0 0 0 0  Altered sleeping  0 0 0 0  Tired, decreased energy  0 0 0 0  Change in appetite  0 0 0 0  Feeling bad  or failure about yourself   0 0 0 0  Trouble concentrating  0 0 0 0  Moving slowly or fidgety/restless  0 0 0 0  Suicidal thoughts  0 0 0 0  PHQ-9 Score  0 0 0 0  Difficult doing work/chores  Not difficult at all Not difficult at all      phq 9 is negative  Fall Risk:    08/04/2024    8:35 AM 12/14/2023    2:34 PM 10/11/2023    7:37 AM 08/13/2023    2:19 PM 02/26/2023    3:52 PM  Fall Risk   Falls in the past year? 0 0 0 0 0  Number falls in past yr: 0 0 0 0   Injury with Fall? 0 0 0 0   Risk for fall due to : No Fall Risks No Fall Risks No Fall Risks No Fall Risks No Fall Risks  Follow up Falls evaluation completed Falls prevention discussed;Education provided;Falls evaluation completed Falls prevention discussed Falls prevention discussed Falls prevention discussed     Assessment & Plan Type 2 diabetes mellitus with diabetic neuropathy and other specified and circulatory complications Diabetes well-controlled with A1c at 6.4%. Occasional hypoglycemia noted. Weight gain due to improved glycemic control. - Continue Novolog  before meals. - Continue Soliqua  60 units daily. - Continue Jardiance  as prescribed. - Provide Dexcom sample and ensure refills. - Perform comprehensive metabolic panel, urine microalbumin, CBC, and lipid  panel. - Advise insulin  timing 15 minutes before meals. - Discuss dietary modifications for weight and carbohydrate management.  Essential hypertension Blood pressure slightly elevated at 138/76 mmHg. Missed dose of amlodipine -benazepril  may have contributed. - Continue amlodipine -benazepril  as prescribed. - Monitor blood pressure at home.  Pure hypercholesterolemia Cholesterol managed with atorvastatin . - Continue atorvastatin  80 mg daily. - Perform lipid panel.  Gastroesophageal reflux disease GERD well-managed with omeprazole . - Continue omeprazole  as prescribed.  Erectile dysfunction Managed with Cialis .  General Health Maintenance Flu vaccine administered today. - Administer flu vaccine.

## 2024-08-05 ENCOUNTER — Ambulatory Visit: Payer: Self-pay | Admitting: Family Medicine

## 2024-08-05 LAB — CBC WITH DIFFERENTIAL/PLATELET
Absolute Lymphocytes: 1247 {cells}/uL (ref 850–3900)
Absolute Monocytes: 314 {cells}/uL (ref 200–950)
Basophils Absolute: 9 {cells}/uL (ref 0–200)
Basophils Relative: 0.2 %
Eosinophils Absolute: 60 {cells}/uL (ref 15–500)
Eosinophils Relative: 1.4 %
HCT: 46.4 % (ref 38.5–50.0)
Hemoglobin: 15.1 g/dL (ref 13.2–17.1)
MCH: 30 pg (ref 27.0–33.0)
MCHC: 32.5 g/dL (ref 32.0–36.0)
MCV: 92.1 fL (ref 80.0–100.0)
MPV: 12.9 fL — ABNORMAL HIGH (ref 7.5–12.5)
Monocytes Relative: 7.3 %
Neutro Abs: 2670 {cells}/uL (ref 1500–7800)
Neutrophils Relative %: 62.1 %
Platelets: 149 Thousand/uL (ref 140–400)
RBC: 5.04 Million/uL (ref 4.20–5.80)
RDW: 13.2 % (ref 11.0–15.0)
Total Lymphocyte: 29 %
WBC: 4.3 Thousand/uL (ref 3.8–10.8)

## 2024-08-05 LAB — COMPREHENSIVE METABOLIC PANEL WITH GFR
AG Ratio: 2.1 (calc) (ref 1.0–2.5)
ALT: 19 U/L (ref 9–46)
AST: 23 U/L (ref 10–35)
Albumin: 4.4 g/dL (ref 3.6–5.1)
Alkaline phosphatase (APISO): 56 U/L (ref 35–144)
BUN: 16 mg/dL (ref 7–25)
CO2: 30 mmol/L (ref 20–32)
Calcium: 9.2 mg/dL (ref 8.6–10.3)
Chloride: 106 mmol/L (ref 98–110)
Creat: 1.07 mg/dL (ref 0.70–1.35)
Globulin: 2.1 g/dL (ref 1.9–3.7)
Glucose, Bld: 80 mg/dL (ref 65–99)
Potassium: 4.3 mmol/L (ref 3.5–5.3)
Sodium: 141 mmol/L (ref 135–146)
Total Bilirubin: 1.2 mg/dL (ref 0.2–1.2)
Total Protein: 6.5 g/dL (ref 6.1–8.1)
eGFR: 79 mL/min/1.73m2 (ref >=60)

## 2024-08-05 LAB — LIPID PANEL
Cholesterol: 137 mg/dL (ref <200)
HDL: 53 mg/dL (ref >=40)
LDL Cholesterol (Calc): 65 mg/dL
Non-HDL Cholesterol (Calc): 84 mg/dL (ref <130)
Total CHOL/HDL Ratio: 2.6 (calc) (ref <5.0)
Triglycerides: 104 mg/dL (ref <150)

## 2024-08-05 LAB — MICROALBUMIN / CREATININE URINE RATIO
Creatinine, Urine: 241 mg/dL (ref 20–320)
Microalb Creat Ratio: 4 mg/g{creat} (ref <30)
Microalb, Ur: 1 mg/dL

## 2024-10-31 ENCOUNTER — Other Ambulatory Visit: Payer: Self-pay | Admitting: Family Medicine

## 2024-10-31 DIAGNOSIS — E1140 Type 2 diabetes mellitus with diabetic neuropathy, unspecified: Secondary | ICD-10-CM

## 2024-10-31 DIAGNOSIS — E1169 Type 2 diabetes mellitus with other specified complication: Secondary | ICD-10-CM

## 2024-10-31 NOTE — Telephone Encounter (Signed)
 Requested Prescriptions  Pending Prescriptions Disp Refills   EMBECTA PEN NEEDLE ULTRAFINE 32G X 6 MM MISC [Pharmacy Med Name: ULTRA-FINE PEN NEEDLE 32G 6MM] 300 each 2    Sig: INJECT 1 EACH INTO THE SKIN 4 (FOUR) TIMES DAILY.     Endocrinology: Diabetes - Testing Supplies Passed - 10/31/2024  5:34 PM      Passed - Valid encounter within last 12 months    Recent Outpatient Visits           2 months ago Dyslipidemia associated with type 2 diabetes mellitus [facility])   [place] [facility] Glenard Mire, MD   7 months ago Dyslipidemia associated with type 2 diabetes mellitus [facility])   [place] [facility] Glenard Mire, MD   9 months ago Acute non-recurrent pansinusitis   [facility] [name] Mliss FALCON, FNP       Future Appointments             In 1 month Sowles, [name], MD [facility], [place]

## 2024-11-07 ENCOUNTER — Other Ambulatory Visit: Payer: Self-pay | Admitting: Family Medicine

## 2024-11-07 DIAGNOSIS — E1169 Type 2 diabetes mellitus with other specified complication: Secondary | ICD-10-CM

## 2024-11-07 NOTE — Telephone Encounter (Unsigned)
 Copied from CRM #[id number]. Topic: Clinical - Medication Refill >> Nov 07, 2024  9:31 AM Joseph Johnson wrote: Medication: insulin  aspart (NOVOLOG  FLEXPEN) 100 UNIT/ML FlexPen  Has the patient contacted their pharmacy? Yes (Agent: If no, request that the patient contact the pharmacy for the refill. If patient does not wish to contact the pharmacy document the reason why and proceed with request.) Cannot fill until  8th  (Agent: If yes, when and what did the pharmacy advise?)  This is the patient's preferred pharmacy:  [id number] [place] - [address] [place] [address] Phone: [phone] Fax: [phone]   Is this the correct pharmacy for this prescription? Yes If no, delete pharmacy and type the correct one.   Has the prescription been filled recently? No  Is the patient out of the medication? No  Has the patient been seen for an appointment in the last year OR does the patient have an upcoming appointment? Yes  Can we respond through MyChart? Yes  Agent: Please be advised that Rx refills may take up to 3 business days. We ask that you follow-up with your pharmacy.

## 2024-11-09 MED ORDER — NOVOLOG FLEXPEN 100 UNIT/ML ~~LOC~~ SOPN: 3.0000 [IU] | PEN_INJECTOR | Freq: Three times a day (TID) | SUBCUTANEOUS | 0 refills | Status: DC

## 2024-11-09 NOTE — Telephone Encounter (Signed)
 Requested Prescriptions  Pending Prescriptions Disp Refills   insulin  aspart (NOVOLOG  FLEXPEN) 100 UNIT/ML FlexPen 45 mL 1    Sig: Inject 3-15 Units into the skin 3 (three) times daily with meals.     Endocrinology:  Diabetes - Insulins Passed - 11/09/2024  5:44 PM      Passed - HBA1C is between 0 and 7.9 and within 180 days    Hemoglobin A1C  Date Value Ref Range Status  08/04/2024 6.4 (A) 4.0 - 5.6 % Final   HbA1c, POC (controlled diabetic range)  Date Value Ref Range Status  07/04/2019 6.8 0.0 - 7.0 % Final   Hgb A1c MFr Bld  Date Value Ref Range Status  03/04/2022 7.6 (H) <5.7 % of total Hgb Final    Comment:    For someone without known diabetes, a hemoglobin A1c value of 6.5% or greater indicates that they may have  diabetes and this should be confirmed with a follow-up  test. . For someone with known diabetes, a value <7% indicates  that their diabetes is well controlled and a value  greater than or equal to 7% indicates suboptimal  control. A1c targets should be individualized based on  duration of diabetes, age, comorbid conditions, and  other considerations. . Currently, no consensus exists regarding use of hemoglobin A1c for diagnosis of diabetes for children. SABRA [name] - Valid encounter within last 6 months    Recent Outpatient Visits           3 months ago Dyslipidemia associated with type 2 diabetes mellitus [facility])   [facility] Glenard Mire, MD   7 months ago Dyslipidemia associated with type 2 diabetes mellitus [facility])   [place] [facility] Sowles, [name], MD   9 months ago Acute non-recurrent pansinusitis   [facility] [name] Mliss FALCON, FNP       Future Appointments             In 1 month Sowles, [name], MD [facility], [place]

## 2024-11-19 ENCOUNTER — Other Ambulatory Visit: Payer: Self-pay | Admitting: Family Medicine

## 2024-11-19 DIAGNOSIS — E1169 Type 2 diabetes mellitus with other specified complication: Secondary | ICD-10-CM

## 2024-11-19 DIAGNOSIS — I10 Essential (primary) hypertension: Secondary | ICD-10-CM

## 2024-11-19 DIAGNOSIS — E7800 Pure hypercholesterolemia, unspecified: Secondary | ICD-10-CM

## 2024-11-19 DIAGNOSIS — K219 Gastro-esophageal reflux disease without esophagitis: Secondary | ICD-10-CM

## 2024-11-21 NOTE — Telephone Encounter (Signed)
 Requested Prescriptions  Pending Prescriptions Disp Refills   omeprazole  (PRILOSEC) 40 MG capsule [Pharmacy Med Name: OMEPRAZOLE  DR 40 MG CAPSULE] 90 capsule 1    Sig: TAKE 1 CAPSULE (40 MG TOTAL) BY MOUTH DAILY.     Gastroenterology: Proton Pump Inhibitors Passed - 11/21/2024  4:39 PM      Passed - Valid encounter within last 12 months    Recent Outpatient Visits           3 months ago Dyslipidemia associated with type 2 diabetes mellitus (HCC)   [place] [facility] Glenard Mire, MD   7 months ago Dyslipidemia associated with type 2 diabetes mellitus [facility])   [place] [facility] Glenard Mire, MD   10 months ago Acute non-recurrent pansinusitis   [facility] [name] Mliss FALCON, FNP       Future Appointments             In 3 weeks Sowles, [name], MD [facility], [place]             amLODipine -benazepril  (LOTREL) 5-40 MG capsule [Pharmacy Med Name: AMLODIPINE -BENAZEPRIL  5-40 MG] 90 capsule 0    Sig: TAKE 1 CAPSULE BY MOUTH EVERY DAY     Cardiovascular: CCB + ACEI Combos Passed - 11/21/2024  4:39 PM      Passed - Cr in normal range and within 180 days    Creat  Date Value Ref Range Status  08/04/2024 1.07 0.70 - 1.35 mg/dL Final   Creatinine, Urine  Date Value Ref Range Status  08/04/2024 241 20 - 320 mg/dL Final         Passed - K in normal range and within 180 days    Potassium  Date Value Ref Range Status  08/04/2024 4.3 3.5 - 5.3 mmol/L Final         Passed - Na in normal range and within 180 days    Sodium  Date Value Ref Range Status  08/04/2024 141 135 - 146 mmol/L Final  08/01/2015 140 134 - 144 mmol/L Final         Passed - eGFR is 30 or above and within 180 days    GFR, Est African American  Date Value Ref Range Status  02/20/2021 110 > OR = 60 mL/min/1.68m2 Final   GFR, Est Non African American  Date Value Ref Range Status  02/20/2021 95 > OR = 60  mL/min/1.8m2 Final   eGFR  Date Value Ref Range Status  08/04/2024 79 > OR = 60 mL/min/1.31m2 Final         Passed - Patient is not pregnant      Passed - Last BP in normal range    BP Readings from Last 1 Encounters:  08/04/24 138/76         Passed - Valid encounter within last 6 months    Recent Outpatient Visits           3 months ago Dyslipidemia associated with type 2 diabetes mellitus [facility])   [place] [facility] Glenard Mire, MD   7 months ago Dyslipidemia associated with type 2 diabetes mellitus [facility])   [place] [facility] Glenard Mire, MD   10 months ago Acute non-recurrent pansinusitis   [facility] [name] Mliss FALCON, FNP       Future Appointments             In 3 weeks Sowles, [name], MD  [facility], [place]             atorvastatin  (LIPITOR) 80 MG tablet [Pharmacy Med Name: ATORVASTATIN  80 MG TABLET] 90 tablet 1    Sig: TAKE 1 TABLET BY MOUTH EVERY DAY     Cardiovascular:  Antilipid - Statins Failed - 11/21/2024  4:39 PM      Failed - Lipid Panel in normal range within the last 12 months    Cholesterol, Total  Date Value Ref Range Status  08/01/2015 249 (H) 100 - 199 mg/dL Final   Cholesterol  Date Value Ref Range Status  08/04/2024 137 <200 mg/dL Final   LDL Cholesterol (Calc)  Date Value Ref Range Status  08/04/2024 65 mg/dL (calc) Final    Comment:    Reference range: <100 . Desirable range <100 mg/dL for primary prevention;   <70 mg/dL for patients with CHD or diabetic patients  with > or = 2 CHD risk factors. SABRA LDL-C is now calculated using the Martin-Hopkins  calculation, which is a validated novel method providing  better accuracy than the Friedewald equation in the  estimation of LDL-C.  Gladis APPLETHWAITE et al. SANDREA. 7986;689(80): 2061-2068  (http://education.[employer].com/faq/FAQ164)    HDL  Date Value Ref Range Status   08/04/2024 53 > OR = 40 mg/dL Final  91/74/7983 52 >60 mg/dL Final    Comment:    According to ATP-III Guidelines, HDL-C >59 mg/dL is considered a negative risk factor for CHD.    Triglycerides  Date Value Ref Range Status  08/04/2024 104 <150 mg/dL Final         Passed - Patient is not pregnant      Passed - Valid encounter within last 12 months    Recent Outpatient Visits           3 months ago Dyslipidemia associated with type 2 diabetes mellitus [facility])   [place] [facility] Glenard Mire, MD   7 months ago Dyslipidemia associated with type 2 diabetes mellitus [facility])   [place] [facility] Sowles, [name], MD   10 months ago Acute non-recurrent pansinusitis   [facility] [name] Mliss FALCON, FNP       Future Appointments             In 3 weeks Sowles, [name], MD [facility], [place]

## 2024-11-23 ENCOUNTER — Other Ambulatory Visit: Payer: Self-pay | Admitting: Family Medicine

## 2024-11-23 DIAGNOSIS — E1169 Type 2 diabetes mellitus with other specified complication: Secondary | ICD-10-CM

## 2024-11-25 NOTE — Telephone Encounter (Signed)
 Requested Prescriptions  Pending Prescriptions Disp Refills   Continuous Glucose Sensor (DEXCOM G7 SENSOR) MISC [Pharmacy Med Name: DEXCOM G7 SENSOR] 9 each 0    Sig: APPLY AS DIRECTED.     Endocrinology: Diabetes - Testing Supplies Passed - 11/25/2024  8:57 AM      Passed - Valid encounter within last 12 months    Recent Outpatient Visits           3 months ago Dyslipidemia associated with type 2 diabetes mellitus [facility])   [place] [facility] Glenard Mire, MD   7 months ago Dyslipidemia associated with type 2 diabetes [facility])   [place] [facility] Glen[facility] Mire, MD   10 months ago Acute non-recurrent pansinusitis   [facility] [name] Mliss FALCON, FNP       Future Appointments             In 2 weeks Sowles, [name], MD [facility], [place]

## 2024-12-15 ENCOUNTER — Ambulatory Visit: Admitting: Family Medicine

## 2024-12-15 ENCOUNTER — Encounter: Payer: Self-pay | Admitting: Family Medicine

## 2024-12-15 VITALS — BP 128/80 | HR 80 | Resp 16 | Ht 71.0 in | Wt 218.8 lb

## 2024-12-15 DIAGNOSIS — E1169 Type 2 diabetes mellitus with other specified complication: Secondary | ICD-10-CM

## 2024-12-15 DIAGNOSIS — E785 Hyperlipidemia, unspecified: Secondary | ICD-10-CM

## 2024-12-15 DIAGNOSIS — I152 Hypertension secondary to endocrine disorders: Secondary | ICD-10-CM

## 2024-12-15 DIAGNOSIS — Z794 Long term (current) use of insulin: Secondary | ICD-10-CM

## 2024-12-15 DIAGNOSIS — E1140 Type 2 diabetes mellitus with diabetic neuropathy, unspecified: Secondary | ICD-10-CM

## 2024-12-15 DIAGNOSIS — K219 Gastro-esophageal reflux disease without esophagitis: Secondary | ICD-10-CM

## 2024-12-15 DIAGNOSIS — I10 Essential (primary) hypertension: Secondary | ICD-10-CM

## 2024-12-15 DIAGNOSIS — E1159 Type 2 diabetes mellitus with other circulatory complications: Secondary | ICD-10-CM

## 2024-12-15 LAB — POCT GLYCOSYLATED HEMOGLOBIN (HGB A1C): Hemoglobin A1C: 6.7 % — AB (ref 4.0–5.6)

## 2024-12-15 MED ORDER — SOLIQUA 100-33 UNT-MCG/ML ~~LOC~~ SOPN: 60.0000 [IU] | PEN_INJECTOR | Freq: Every morning | SUBCUTANEOUS | 1 refills | Status: AC

## 2024-12-15 MED ORDER — NOVOLOG FLEXPEN 100 UNIT/ML ~~LOC~~ SOPN: 3.0000 [IU] | PEN_INJECTOR | Freq: Three times a day (TID) | SUBCUTANEOUS | 0 refills | Status: DC

## 2024-12-15 MED ORDER — DEXCOM G7 SENSOR MISC: 1.0000 | 1 refills | Status: AC

## 2024-12-15 MED ORDER — AMLODIPINE BESY-BENAZEPRIL HCL 5-40 MG PO CAPS: 1.0000 | ORAL_CAPSULE | Freq: Every day | ORAL | 1 refills | Status: AC

## 2024-12-15 MED ORDER — EMPAGLIFLOZIN 25 MG PO TABS: 25.0000 mg | ORAL_TABLET | Freq: Every day | ORAL | 1 refills | Status: AC

## 2024-12-15 MED ORDER — TADALAFIL 20 MG PO TABS: 20.0000 mg | ORAL_TABLET | ORAL | 0 refills | Status: AC

## 2024-12-15 NOTE — Progress Notes (Signed)
 Name: Joseph Johnson   MRN: [id number]    DOB: [birth year]   Date:12/15/2024       Progress Note  Subjective  Chief Complaint  Chief Complaint  Patient presents with   Medical Management of Chronic Issues   Discussed the use of AI scribe software for clinical note transcription with the patient, who gave verbal consent to proceed.  History of Present Illness Joseph Johnson is a 64 year old male with type 2 diabetes who presents for diabetes management.  His hemoglobin A1c has increased from 6.4% to 6.7%. He reports dietary indulgences during the holidays. He uses a Dexcom continuous glucose monitor, which shows significant glucose variability with a standard deviation of 56. Glucose levels are often low between 3 AM and 6 AM, and high after lunch. He acknowledges dietary lapses, particularly with desserts and sugary beverages like Pepsi.  Date of Download: 12/15/2024 % Time CGM is active: 94% Average Glucose: 149 mg/dL Glucose Management Indicator: Dexcom  Glucose Variability: 56 (goal <36%) Time in Goal:  - Time in range 70-180: 64% - Time above range: 29% - Time below range: 7%   He is currently taking Jardiance  and uses Soliqua  at a dose of 60 units. He does not consistently administer pre-meal insulin  15 minutes before meals. No symptoms of polyuria, polydipsia, or polyphagia are present.  He has  hypertension, managed with Lotrel (amlodipine /benazepril ) 5/40 mg. No chest pain, palpitations, or shortness of breath are reported.  His dyslipidemia is managed with atorvastatin  80 mg, and he denies experiencing any muscle aches.  He has a history of erectile dysfunction and takes Cialis  20 mg prn , gets rx from [employer].  He takes omeprazole  for GERD, with symptoms well-controlled. No recent outbreaks of herpes simplex virus have occurred, and he has not needed to use Valtrex .  His weight is stable, though he is considered obese.    Patient Active Problem List    Diagnosis Date Noted   Type 2 diabetes mellitus with diabetic neuropathy, with long-term current use of insulin  (HCC) 09/14/2022   Gastroesophageal reflux disease without esophagitis 09/14/2022   Hypertension complicating diabetes (HCC) 09/14/2022   Dyslipidemia associated with type 2 diabetes mellitus (HCC) 09/14/2022   Allergic rhinitis, seasonal 03/03/2016   Hypercholesteremia 06/20/2015   Benign essential HTN 02/02/2008    History reviewed. No pertinent surgical history.  Family History  Problem Relation Age of Onset   Hypertension Mother    Diabetes Father    Diabetes Brother    Hypercalcemia Brother    Hypertension Brother     Social History   Tobacco Use   Smoking status: Former    Current packs/day: 0.00    Types: Cigarettes    Quit date: 09/19/1990    Years since quitting: 34.2   Smokeless tobacco: Never   Tobacco comments:    more than 20 years ago  Substance Use Topics   Alcohol use: No    Alcohol/week: 0.0 standard drinks of alcohol    Current Medications[1]  Allergies[2]  I personally reviewed active problem list, medication list, allergies, family history with the patient/caregiver today.   ROS  Ten systems reviewed and is negative except as mentioned in HPI    Objective Physical Exam  CONSTITUTIONAL: Patient appears well-developed and well-nourished. No distress. HEENT: Head atraumatic, normocephalic, neck supple. CARDIOVASCULAR: Normal rate, regular rhythm and normal heart sounds. No murmur heard. No BLE edema. Pulse normal. PULMONARY: Effort normal and breath sounds normal. No respiratory distress. ABDOMINAL: There  is no tenderness or distention. MUSCULOSKELETAL: Normal gait. Without gross motor or sensory deficit. Foot exam normal. PSYCHIATRIC: Patient has a normal mood and affect. Behavior is normal. Judgment and thought content normal. NEUROLOGICAL: Sensation intact in feet.  Vitals:   12/15/24 0832  BP: 128/80  Pulse: 80  Resp: 16   SpO2: 94%  Weight: 218 lb 12.8 oz (99.2 kg)  Height: 5' 11 (1.803 m)    Body mass index is 30.52 kg/m.  Recent Results (from the past 2160 hours)  POCT glycosylated hemoglobin (Hb A1C)     Status: Abnormal   Collection Time: 12/15/24  8:41 AM  Result Value Ref Range   Hemoglobin A1C 6.7 (A) 4.0 - 5.6 %   HbA1c POC (<> result, manual entry)     HbA1c, POC (prediabetic range)     HbA1c, POC (controlled diabetic range)      Diabetic Foot Exam:  Diabetic foot exam was performed with the following findings:   No deformities, ulcerations, or other skin breakdown Normal sensation of 10g monofilament Intact posterior tibialis and dorsalis pedis pulses      PHQ2/9:    12/15/2024    8:26 AM 08/04/2024    8:35 AM 04/04/2024    8:43 AM 12/14/2023    2:44 PM 10/11/2023    7:37 AM  Depression screen PHQ 2/9  Decreased Interest 0 0 0 0 0  Down, Depressed, Hopeless 0 0 0 0 0  PHQ - 2 Score 0 0 0 0 0  Altered sleeping   0 0 0  Tired, decreased energy   0 0 0  Change in appetite   0 0 0  Feeling bad or failure about yourself    0 0 0  Trouble concentrating   0 0 0  Moving slowly or fidgety/restless   0 0 0  Suicidal thoughts   0 0 0  PHQ-9 Score   0  0  0   Difficult doing work/chores   Not difficult at all Not difficult at all      Data saved with a previous flowsheet row definition    phq 9 is negative  Fall Risk:    12/15/2024    8:26 AM 08/04/2024    8:35 AM 12/14/2023    2:34 PM 10/11/2023    7:37 AM 08/13/2023    2:19 PM  Fall Risk   Falls in the past year? 0 0 0 0 0  Number falls in past yr: 0 0 0 0 0  Injury with Fall? 0 0  0  0  0   Risk for fall due to : No Fall Risks No Fall Risks No Fall Risks No Fall Risks No Fall Risks  Follow up Falls evaluation completed Falls evaluation completed Falls prevention discussed;Education provided;Falls evaluation completed Falls prevention discussed Falls prevention discussed     Data saved with a previous flowsheet row definition      Assessment & Plan Type 2 diabetes mellitus complicated by hypertension, dyslipidemia, and diabetic neuropathy A1c increased to 6.7%. Blood glucose variability noted. Dietary indiscretions identified. Diabetic neuropathy present without pain. Erectile dysfunction stable. - Refilled Jardiance  prescription. - Refilled Dexcom sensors for 10-day use. - Advised dietary modifications: reduce sweet beverages and desserts, increase protein intake. - Recommended plain peanut butter before bed to prevent nocturnal hypoglycemia. - Instructed pre-meal insulin  administration 15 minutes before meals. - Continue Soliqua  60 units as prescribed. - Scheduled follow-up in 3 months.  Essential hypertension Well-controlled with current regimen. Blood  pressure 128/80 mmHg. - Continue Lotrel (benazepril  5 mg/amlodipine  40 mg) as prescribed.  Gastroesophageal reflux disease Well-controlled with current regimen. - Continue omeprazole  daily as prescribed.  Obesity Weight slightly above normal. Discussed weight loss benefits and strategies. - Encouraged weight loss of 5-6 pounds to move below obesity threshold.          [1]  Current Outpatient Medications:    ACCU-CHEK GUIDE test strip, 1 each by Other route 3 (three) times daily. Use as instructed, Disp: 300 strip, Rfl: 0   amLODipine -benazepril  (LOTREL) 5-40 MG capsule, TAKE 1 CAPSULE BY MOUTH EVERY DAY, Disp: 90 capsule, Rfl: 0   aspirin 81 MG tablet, , Disp: , Rfl:    atorvastatin  (LIPITOR) 80 MG tablet, TAKE 1 TABLET BY MOUTH EVERY DAY, Disp: 90 tablet, Rfl: 1   Continuous Glucose Sensor (DEXCOM G7 SENSOR) MISC, APPLY AS DIRECTED., Disp: 9 each, Rfl: 0   empagliflozin  (JARDIANCE ) 25 MG TABS tablet, Take 1 tablet (25 mg total) by mouth daily., Disp: 90 tablet, Rfl: 1   insulin  aspart (NOVOLOG  FLEXPEN) 100 UNIT/ML FlexPen, Inject 3-15 Units into the skin 3 (three) times daily with meals., Disp: 45 mL, Rfl: 0   Insulin  Glargine-Lixisenatide  (SOLIQUA ) 100-33 UNT-MCG/ML SOPN, Inject 60 Units into the skin every morning., Disp: 45 mL, Rfl: 1   Insulin  Pen Needle (EMBECTA PEN NEEDLE ULTRAFINE) 32G X 6 MM MISC, INJECT 1 EACH INTO THE SKIN 4 (FOUR) TIMES DAILY., Disp: 400 each, Rfl: 1   levocetirizine (XYZAL ) 5 MG tablet, TAKE 1 TABLET (5 MG TOTAL) BY MOUTH EVERY EVENING., Disp: 90 tablet, Rfl: 0   omeprazole  (PRILOSEC) 40 MG capsule, TAKE 1 CAPSULE (40 MG TOTAL) BY MOUTH DAILY., Disp: 90 capsule, Rfl: 1   tadalafil  (CIALIS ) 20 MG tablet, Take 1 tablet (20 mg total) by mouth every 3 (three) days., Disp: 90 tablet, Rfl: 0   valACYclovir  (VALTREX ) 500 MG tablet, Take 1 tablet (500 mg total) by mouth 3 (three) times daily., Disp: 30 tablet, Rfl: 2   Glucagon  (GVOKE HYPOPEN  1-PACK) 1 MG/0.2ML SOAJ, Inject 1 each into the skin daily as needed. Severe hypoglycemia (Patient not taking: Reported on 12/15/2024), Disp: 0.2 mL, Rfl: 1 [2] No Known Allergies

## 2025-01-05 ENCOUNTER — Other Ambulatory Visit: Payer: Self-pay | Admitting: Family Medicine

## 2025-01-05 DIAGNOSIS — E1169 Type 2 diabetes mellitus with other specified complication: Secondary | ICD-10-CM

## 2025-01-05 NOTE — Telephone Encounter (Unsigned)
 Copied from CRM [id number]. Topic: Clinical - Medication Refill >> Jan 05, 2025  1:24 PM Emylou G wrote: Medication: insulin  aspart (NOVOLOG  FLEXPEN) 100 UNIT/ML FlexPen  Patient adv has a half a pen left.. he said he feels like use dosage has increased.. pharmacy saying he is filling too soon.SABRA Pls review for refill.  Pls call if we can do samples.. or have issues  Has the patient contacted their pharmacy? Yes (Agent: If no, request that the patient contact the pharmacy for the refill. If patient does not wish to contact the pharmacy document the reason why and proceed with request.) (Agent: If yes, when and what did the pharmacy advise?) said to call us   This is the patient's preferred pharmacy:  [id number] [place] [facility] - [address] [place] [address] Phone: [phone] Fax: [phone]   Is this the correct pharmacy for this prescription? Yes If no, delete pharmacy and type the correct one.   Has the prescription been filled recently? No  Is the patient out of the medication? Yes  Has the patient been seen for an appointment in the last year OR does the patient have an upcoming appointment? Yes  Can we respond through MyChart? yes  Agent: Please be advised that Rx refills may take up to 3 business days. We ask that you follow-up with your pharmacy.

## 2025-01-08 NOTE — Telephone Encounter (Signed)
 Rx 12/15/24 45ml- too soon Requested Prescriptions  Pending Prescriptions Disp Refills   insulin  aspart (NOVOLOG  FLEXPEN) 100 UNIT/ML FlexPen 45 mL 0    Sig: Inject 3-15 Units into the skin 3 (three) times daily with meals.     Endocrinology:  Diabetes - Insulins Passed - 01/08/2025  1:39 PM      Passed - HBA1C is between 0 and 7.9 and within 180 days    Hemoglobin A1C  Date Value Ref Range Status  12/15/2024 6.7 (A) 4.0 - 5.6 % Final   HbA1c, POC (controlled diabetic range)  Date Value Ref Range Status  07/04/2019 6.8 0.0 - 7.0 % Final   Hgb A1c MFr Bld  Date Value Ref Range Status  03/04/2022 7.6 (H) <5.7 % of total Hgb Final    Comment:    For someone without known diabetes, a hemoglobin A1c value of 6.5% or greater indicates that they may have  diabetes and this should be confirmed with a follow-up  test. . For someone with known diabetes, a value <7% indicates  that their diabetes is well controlled and a value  greater than or equal to 7% indicates suboptimal  control. A1c targets should be individualized based on  duration of diabetes, age, comorbid conditions, and  other considerations. . Currently, no consensus exists regarding use of hemoglobin A1c for diagnosis of diabetes for children. Joseph Johnson [name] - Valid encounter within last 6 months    Recent Outpatient Visits           3 weeks ago Dyslipidemia associated with type 2 diabetes mellitus [facility])   [place] [facility] Matheny, [name], MD   5 months ago Dyslipidemia associated with type 2 diabetes mellitus [facility])   [place] [facility] Sowles, [name], MD   9 months ago Dyslipidemia associated with type 2 diabetes mellitus [facility])   [place] [facility] Sowles, [name], MD   11 months ago Acute non-recurrent pansinusitis   [facility] [name] Mliss [place], [place]

## 2025-01-09 ENCOUNTER — Telehealth: Payer: Self-pay

## 2025-01-09 DIAGNOSIS — E1169 Type 2 diabetes mellitus with other specified complication: Secondary | ICD-10-CM

## 2025-01-09 MED ORDER — NOVOLOG FLEXPEN 100 UNIT/ML ~~LOC~~ SOPN: 20.0000 [IU] | PEN_INJECTOR | Freq: Three times a day (TID) | SUBCUTANEOUS | 0 refills | Status: AC

## 2025-01-09 NOTE — Telephone Encounter (Signed)
 Patient is out of insulin  and pharmacy states to soon for refill.  He increased dose to control sugars without regarding you first.  He states is currently taking 20-25 units tid.  Was last seen on 1/9.  Please send in new Rx w/ new increase so he can pick up today.  I told him to be sure in future to notify us  regarding this so we can properly notify pharmacy and ins.

## 2025-01-09 NOTE — Telephone Encounter (Signed)
 Pt called to report that he has been instructed to take more than what he has been prescribed, says he was instructed to increase his dose by his PCP.   Says he only has one syringe left.   [id number] [place] [address]  [address] [place] [address]  Phone: [phone] Fax: [phone]

## 2025-01-10 NOTE — Telephone Encounter (Signed)
 Changed, and updated patient

## 2025-03-23 ENCOUNTER — Ambulatory Visit: Admitting: Family Medicine
